# Patient Record
Sex: Female | Born: 1975 | Race: Black or African American | Hispanic: No | Marital: Married | Smoking: Never smoker
Health system: Southern US, Community
[De-identification: ages and names within clinical notes are randomized; demographics above are authoritative.]

## PROBLEM LIST (undated history)

## (undated) DIAGNOSIS — R0602 Shortness of breath: Secondary | ICD-10-CM

## (undated) DIAGNOSIS — F419 Anxiety disorder, unspecified: Secondary | ICD-10-CM

## (undated) DIAGNOSIS — T4145XA Adverse effect of unspecified anesthetic, initial encounter: Secondary | ICD-10-CM

## (undated) DIAGNOSIS — G8929 Other chronic pain: Secondary | ICD-10-CM

## (undated) DIAGNOSIS — M549 Dorsalgia, unspecified: Secondary | ICD-10-CM

## (undated) DIAGNOSIS — I499 Cardiac arrhythmia, unspecified: Secondary | ICD-10-CM

## (undated) DIAGNOSIS — R109 Unspecified abdominal pain: Secondary | ICD-10-CM

## (undated) HISTORY — PX: OTHER SURGICAL HISTORY: SHX169

## (undated) HISTORY — PX: CERVICAL CERCLAGE: SHX1329

## (undated) HISTORY — DX: Cardiac arrhythmia, unspecified: I49.9

## (undated) HISTORY — DX: Shortness of breath: R06.02

## (undated) HISTORY — PX: TONSILLECTOMY: SUR1361

---

## 2009-01-28 DIAGNOSIS — T8859XA Other complications of anesthesia, initial encounter: Secondary | ICD-10-CM

## 2009-01-28 HISTORY — DX: Other complications of anesthesia, initial encounter: T88.59XA

## 2014-01-19 ENCOUNTER — Emergency Department (HOSPITAL_COMMUNITY): Payer: BC Managed Care – PPO

## 2014-01-19 ENCOUNTER — Emergency Department (HOSPITAL_COMMUNITY)
Admission: EM | Admit: 2014-01-19 | Discharge: 2014-01-20 | Disposition: A | Discharge status: Home / Self Care | Payer: BC Managed Care – PPO | Attending: Emergency Medicine | Admitting: Emergency Medicine

## 2014-01-19 ENCOUNTER — Encounter (HOSPITAL_COMMUNITY): Payer: Self-pay | Admitting: Emergency Medicine

## 2014-01-19 DIAGNOSIS — F411 Generalized anxiety disorder: Secondary | ICD-10-CM | POA: Insufficient documentation

## 2014-01-19 DIAGNOSIS — K5900 Constipation, unspecified: Secondary | ICD-10-CM

## 2014-01-19 DIAGNOSIS — T887XXA Unspecified adverse effect of drug or medicament, initial encounter: Secondary | ICD-10-CM

## 2014-01-19 DIAGNOSIS — Z79899 Other long term (current) drug therapy: Secondary | ICD-10-CM | POA: Insufficient documentation

## 2014-01-19 DIAGNOSIS — T448X5A Adverse effect of centrally-acting and adrenergic-neuron-blocking agents, initial encounter: Secondary | ICD-10-CM | POA: Insufficient documentation

## 2014-01-19 DIAGNOSIS — E860 Dehydration: Secondary | ICD-10-CM | POA: Insufficient documentation

## 2014-01-19 DIAGNOSIS — R1032 Left lower quadrant pain: Secondary | ICD-10-CM | POA: Insufficient documentation

## 2014-01-19 LAB — COMPREHENSIVE METABOLIC PANEL
ALT: 15 U/L (ref 0–35)
AST: 25 U/L (ref 0–37)
Albumin: 4.8 g/dL (ref 3.5–5.2)
Alkaline Phosphatase: 69 U/L (ref 39–117)
BILIRUBIN TOTAL: 0.6 mg/dL (ref 0.3–1.2)
BUN: 21 mg/dL (ref 6–23)
CALCIUM: 10.5 mg/dL (ref 8.4–10.5)
CHLORIDE: 98 meq/L (ref 96–112)
CO2: 23 meq/L (ref 19–32)
Creatinine, Ser: 1.44 mg/dL — ABNORMAL HIGH (ref 0.50–1.10)
GFR calc Af Amer: 53 mL/min — ABNORMAL LOW (ref >90)
GFR, EST NON AFRICAN AMERICAN: 46 mL/min — AB (ref >90)
Glucose, Bld: 104 mg/dL — ABNORMAL HIGH (ref 70–99)
POTASSIUM: 3.7 meq/L (ref 3.7–5.3)
SODIUM: 139 meq/L (ref 137–147)
Total Protein: 9.3 g/dL — ABNORMAL HIGH (ref 6.0–8.3)

## 2014-01-19 LAB — CBC WITH DIFFERENTIAL/PLATELET
BASOS ABS: 0 10*3/uL (ref 0.0–0.1)
Basophils Relative: 0 % (ref 0–1)
Eosinophils Absolute: 0.2 10*3/uL (ref 0.0–0.7)
Eosinophils Relative: 2 % (ref 0–5)
HCT: 45.2 % (ref 36.0–46.0)
Hemoglobin: 15.6 g/dL — ABNORMAL HIGH (ref 12.0–15.0)
LYMPHS PCT: 23 % (ref 12–46)
Lymphs Abs: 1.9 10*3/uL (ref 0.7–4.0)
MCH: 28.7 pg (ref 26.0–34.0)
MCHC: 34.5 g/dL (ref 30.0–36.0)
MCV: 83.2 fL (ref 78.0–100.0)
Monocytes Absolute: 0.6 10*3/uL (ref 0.1–1.0)
Monocytes Relative: 8 % (ref 3–12)
NEUTROS ABS: 5.4 10*3/uL (ref 1.7–7.7)
NEUTROS PCT: 67 % (ref 43–77)
PLATELETS: 427 10*3/uL — AB (ref 150–400)
RBC: 5.43 MIL/uL — ABNORMAL HIGH (ref 3.87–5.11)
RDW: 13.4 % (ref 11.5–15.5)
WBC: 8.1 10*3/uL (ref 4.0–10.5)

## 2014-01-19 NOTE — ED Notes (Signed)
The pt has not felt she could get her breath very well for approx one week hyperventilating in triage.  She has felt strange and dizzy also.  She was switched to a new bp med one week ago and she has not felt well since then.Marland Kitchen  Hx of anxiety and or panic  Attacks but these do not fell like that

## 2014-01-20 MED ORDER — ONDANSETRON 4 MG PO TBDP
8.0000 mg | ORAL_TABLET | Freq: Once | ORAL | Status: AC
Start: 2014-01-20 — End: 2014-01-20
  Administered 2014-01-20: 8 mg via ORAL
  Filled 2014-01-20: qty 2

## 2014-01-20 MED ORDER — ONDANSETRON 8 MG PO TBDP: 8.0000 mg | ORAL_TABLET | Freq: Three times a day (TID) | ORAL | Status: DC | PRN

## 2014-01-20 MED ORDER — POLYETHYLENE GLYCOL 3350 17 G PO PACK: 17.0000 g | PACK | Freq: Every day | ORAL | Status: DC

## 2014-01-20 MED ORDER — DOCUSATE SODIUM 100 MG PO CAPS: 100.0000 mg | ORAL_CAPSULE | Freq: Two times a day (BID) | ORAL | Status: DC

## 2014-01-20 NOTE — ED Notes (Signed)
MD at bedside. 

## 2014-01-20 NOTE — Discharge Instructions (Signed)
Increase your fluid and fiber intake.  One way to do this is by "green" smoothies, with spinach, frozen fruit, and yogurt, water and juice.  Stop taking Adipex-P (phentermine) as it may be causing some of your symptoms.     Constipation, Adult Constipation is when a person has fewer than 3 bowel movements a week; has difficulty having a bowel movement; or has stools that are dry, hard, or larger than normal. As people grow older, constipation is more common. If you try to fix constipation with medicines that make you have a bowel movement (laxatives), the problem may get worse. Long-term laxative use may cause the muscles of the colon to become weak. A low-fiber diet, not taking in enough fluids, and taking certain medicines may make constipation worse. CAUSES   Certain medicines, such as antidepressants, pain medicine, iron supplements, antacids, and water pills.   Certain diseases, such as diabetes, irritable bowel syndrome (IBS), thyroid disease, or depression.   Not drinking enough water.   Not eating enough fiber-rich foods.   Stress or travel.  Lack of physical activity or exercise.  Not going to the restroom when there is the urge to have a bowel movement.  Ignoring the urge to have a bowel movement.  Using laxatives too much. SYMPTOMS   Having fewer than 3 bowel movements a week.   Straining to have a bowel movement.   Having hard, dry, or larger than normal stools.   Feeling full or bloated.   Pain in the lower abdomen.  Not feeling relief after having a bowel movement. DIAGNOSIS  Your caregiver will take a medical history and perform a physical exam. Further testing may be done for severe constipation. Some tests may include:   A barium enema X-ray to examine your rectum, colon, and sometimes, your small intestine.  A sigmoidoscopy to examine your lower colon.  A colonoscopy to examine your entire colon. TREATMENT  Treatment will depend on the severity  of your constipation and what is causing it. Some dietary treatments include drinking more fluids and eating more fiber-rich foods. Lifestyle treatments may include regular exercise. If these diet and lifestyle recommendations do not help, your caregiver may recommend taking over-the-counter laxative medicines to help you have bowel movements. Prescription medicines may be prescribed if over-the-counter medicines do not work.  HOME CARE INSTRUCTIONS   Increase dietary fiber in your diet, such as fruits, vegetables, whole grains, and beans. Limit high-fat and processed sugars in your diet, such as [place] fries, hamburgers, cookies, candies, and soda.   A fiber supplement may be added to your diet if you cannot get enough fiber from foods.   Drink enough fluids to keep your urine clear or pale yellow.   Exercise regularly or as directed by your caregiver.   Go to the restroom when you have the urge to go. Do not hold it.  Only take medicines as directed by your caregiver. Do not take other medicines for constipation without talking to your caregiver first. [place] IF:   You have bright red blood in your stool.   Your constipation lasts for more than 4 days or gets worse.   You have abdominal or rectal pain.   You have thin, pencil-like stools.  You have unexplained weight loss. MAKE SURE YOU:   Understand these instructions.  Will watch your condition.  Will get help right away if you are not doing well or get worse. Document Released: 06/12/2004 Document Revised: 12/07/2011 Document Reviewed:  06/26/2013 ExitCare Patient Information 2014 [place].  Fiber Content in Foods Drinking plenty of fluids and consuming foods high in fiber can help with constipation. See the list below for the fiber content of some common foods. Starches and Grains / Dietary Fiber (g)  Cheerios, 1 cup / 3 g  Kellogg's Corn Flakes, 1 cup / 0.7 g  Rice Krispies, 1  cup  / 0.3 g  Quaker Oat Life Cereal,  cup / 2.1 g  Oatmeal, instant (cooked),  cup / 2 g  Kellogg's Frosted Mini Wheats, 1 cup / 5.1 g  Rice, brown, long-grain (cooked), 1 cup / 3.5 g  Rice, white, long-grain (cooked), 1 cup / 0.6 g  Macaroni, cooked, enriched, 1 cup / 2.5 g Legumes / Dietary Fiber (g)  Beans, baked, canned, plain or vegetarian,  cup / 5.2 g  Beans, kidney, canned,  cup / 6.8 g  Beans, pinto, dried (cooked),  cup / 7.7 g  Beans, pinto, canned,  cup / 5.5 g Breads and Crackers / Dietary Fiber (g)  Graham crackers, plain or honey, 2 squares / 0.7 g  Saltine crackers, 3 squares / 0.3 g  Pretzels, plain, salted, 10 pieces / 1.8 g  Bread, whole-wheat, 1 slice / 1.9 g  Bread, white, 1 slice / 0.7 g  Bread, raisin, 1 slice / 1.2 g  Bagel, plain, 3 oz / 2 g  Tortilla, flour, 1 oz / 0.9 g  Tortilla, corn, 1 small / 1.5 g  Bun, hamburger or hotdog, 1 small / 0.9 g Fruits / Dietary Fiber (g)  Apple, raw with skin, 1 medium / 4.4 g  Applesauce, sweetened,  cup / 1.5 g  Banana,  medium / 1.5 g  Grapes, 10 grapes / 0.4 g  Orange, 1 small / 2.3 g  Raisin, 1.5 oz / 1.6 g  Melon, 1 cup / 1.4 g Vegetables / Dietary Fiber (g)  Green beans, canned,  cup / 1.3 g  Carrots (cooked),  cup / 2.3 g  Broccoli (cooked),  cup / 2.8 g  Peas, frozen (cooked),  cup / 4.4 g  Potatoes, mashed,  cup / 1.6 g  Lettuce, 1 cup / 0.5 g  Corn, canned,  cup / 1.6 g  Tomato,  cup / 1.1 g Document Released: 01/31/2007 Document Revised: 12/07/2011 Document Reviewed: 03/28/2007 ExitCare Patient Information 2014 [place], [place].  Dehydration, Adult Dehydration is when you lose more fluids from the body than you take in. Vital organs like the kidneys, brain, and heart cannot function without a proper amount of fluids and salt. Any loss of fluids from the body can cause dehydration.  CAUSES   Vomiting.  Diarrhea.  Excessive sweating.  Excessive urine  output.  Fever. SYMPTOMS  Mild dehydration  Thirst.  Dry lips.  Slightly dry mouth. Moderate dehydration  Very dry mouth.  Sunken eyes.  Skin does not bounce back quickly when lightly pinched and released.  Dark urine and decreased urine production.  Decreased tear production.  Headache. Severe dehydration  Very dry mouth.  Extreme thirst.  Rapid, weak pulse (more than 100 beats per minute at rest).  Cold hands and feet.  Not able to sweat in spite of heat and temperature.  Rapid breathing.  Blue lips.  Confusion and lethargy.  Difficulty being awakened.  Minimal urine production.  No tears. DIAGNOSIS  Your caregiver will diagnose dehydration based on your symptoms and your exam. Blood and urine tests will help confirm the diagnosis. The  diagnostic evaluation should also identify the cause of dehydration. TREATMENT  Treatment of mild or moderate dehydration can often be done at home by increasing the amount of fluids that you drink. It is best to drink small amounts of fluid more often. Drinking too much at one time can make vomiting worse. Refer to the home care instructions below. Severe dehydration needs to be treated at the hospital where you will probably be given intravenous (IV) fluids that contain water and electrolytes. HOME CARE INSTRUCTIONS   Ask your caregiver about specific rehydration instructions.  Drink enough fluids to keep your urine clear or pale yellow.  Drink small amounts frequently if you have nausea and vomiting.  Eat as you normally do.  Avoid:  Foods or drinks high in sugar.  Carbonated drinks.  Juice.  Extremely hot or cold fluids.  Drinks with caffeine.  Fatty, greasy foods.  Alcohol.  Tobacco.  Overeating.  Gelatin desserts.  Wash your hands well to avoid spreading bacteria and viruses.  Only take over-the-counter or prescription medicines for pain, discomfort, or fever as directed by your  caregiver.  Ask your caregiver if you should continue all prescribed and over-the-counter medicines.  Keep all follow-up appointments with your caregiver. SEEK MEDICAL CARE IF:  You have abdominal pain and it increases or stays in one area (localizes).  You have a rash, stiff neck, or severe headache.  You are irritable, sleepy, or difficult to awaken.  You are weak, dizzy, or extremely thirsty. SEEK IMMEDIATE MEDICAL CARE IF:   You are unable to keep fluids down or you get worse despite treatment.  You have frequent episodes of vomiting or diarrhea.  You have blood or green matter (bile) in your vomit.  You have blood in your stool or your stool looks black and tarry.  You have not urinated in 6 to 8 hours, or you have only urinated a small amount of very dark urine.  You have a fever.  You faint. MAKE SURE YOU:   Understand these instructions.  Will watch your condition.  Will get help right away if you are not doing well or get worse. Document Released: 09/14/2005 Document Revised: 12/07/2011 Document Reviewed: 05/04/2011 [facility] Patient Information 2014 [place], [place].  Rehydration, Adult Rehydration is the replacement of body fluids lost during dehydration. Dehydration is an extreme loss of body fluids to the point of body function impairment. There are many ways extreme fluid loss can occur, including vomiting, diarrhea, or excess sweating. Recovering from dehydration requires replacing lost fluids, continuing to eat to maintain strength, and avoiding foods and beverages that may contribute to further fluid loss or may increase nausea. HOW TO REHYDRATE In most cases, rehydration involves the replacement of not only fluids but also carbohydrates and basic body salts. Rehydration with an oral rehydration solution is one way to replace essential nutrients lost through dehydration. An oral rehydration solution can be purchased at pharmacies, retail stores, and online.  Premixed packets of powder that you combine with water to make a solution are also sold. You can prepare an oral rehydration solution at home by mixing the following ingredients together:      tsp table salt.   tsp baking soda.   tsp salt substitute containing potassium chloride.  1 tablespoons sugar.  1 L (34 oz) of water. Be sure to use exact measurements. Including too much sugar can make diarrhea worse. Drink  1 cup (120 240 mL) of oral rehydration solution each time you have diarrhea or  vomit. If drinking this amount makes your vomiting worse, try drinking smaller amounts more often. For example, drink 1 3 tsp every 5 10 minutes.  A general rule for staying hydrated is to drink 1 2 L of fluid per day. Talk to your caregiver about the specific amount you should be drinking each day. Drink enough fluids to keep your urine clear or pale yellow. EATING WHEN DEHYDRATED Even if you have had severe sweating or you are having diarrhea, do not stop eating. Many healthy items in a normal diet are okay to continue eating while recovering from dehydration. The following tips can help you to lessen nausea when you eat:  Ask someone else to prepare your food. Cooking smells may worsen nausea.  Eat in a well-ventilated room away from cooking smells.  Sit up when you eat. Avoid lying down until 1 2 hours after eating.  Eat small amounts when you eat.  Eat foods that are easy to digest. These include soft, well-cooked, or mashed foods. FOODS AND BEVERAGES TO AVOID Avoid eating or drinking the following foods and beverages that may increase nausea or further loss of fluid:   Fruit juices with a high sugar content, such as concentrated juices.  Alcohol.  Beverages containing caffeine.  Carbonated drinks. They may cause a lot of gas.  Foods that may cause a lot of gas, such as cabbage, broccoli, and beans.  Fatty, greasy, and fried foods.  Spicy, very salty, and very sweet foods or  drinks.  Foods or drinks that are very hot or very cold. Consume food or drinks at or near room temperature.  Foods that need a lot of chewing, such as raw vegetables.  Foods that are sticky or hard to swallow, such as peanut butter. Document Released: 12/07/2011 Document Revised: 06/08/2012 Document Reviewed: 12/07/2011 [facility] Patient Information 2014 [place].

## 2014-01-20 NOTE — ED Provider Notes (Signed)
CSN: [id number]     Arrival date & time 01/19/14  2039 History   First MD Initiated Contact with Patient 01/20/14 [phone]     Chief Complaint  Patient presents with  . Shortness of Breath     (Consider location/radiation/quality/duration/timing/severity/associated sxs/prior Treatment) HPI 38 year old female presents to emergency department with complaint of one week of palpitations, and jitteriness, chest tightness, dizziness.  Symptoms started soon after seeing her new private care doctor who started her on Topamax, Maxzide, phentermine.  Patient is concerned that it is her blood pressure medicine is making her feel this way.  Patient has history of anxiety, but reports her symptoms do not feel like a panic attack.  Patient reports she's had nausea for which she has taken Phenergan.  Patient also complaining of some mild diffuse lower abdominal pain.  Patient reports it's been 5 days since her last bowel movement.  After seeing her doctor a week ago, he prescribed her Linzess for constipation.  Patient reports she took it for a day or 2, and then had explosive watery diarrhea.  Since that time, she has been wary of trying other medications for her constipation.  No fevers chills cough.  No headache, no weakness. History reviewed. No pertinent past medical history. History reviewed. No pertinent past surgical history. No family history on file. History  Substance Use Topics  . Smoking status: Never Smoker   . Smokeless tobacco: Not on file  . Alcohol Use: Yes   OB History   Grav Para Term Preterm Abortions TAB SAB Ect Mult Living                 Review of Systems  See History of Present Illness; otherwise all other systems are reviewed and negative   Allergies  Review of patient's allergies indicates no known allergies.  Home Medications   Prior to Admission medications   Medication Sig Start Date End Date Taking? Authorizing Provider  ALPRAZolam Duanne Moron) 1 MG tablet Take 1 mg by  mouth 2 (two) times daily as needed for anxiety.   Yes Historical Provider, MD  docusate sodium (COLACE) 100 MG capsule Take 1 capsule (100 mg total) by mouth every 12 (twelve) hours. 01/20/14   Kalman Drape, MD  ondansetron (ZOFRAN ODT) 8 MG disintegrating tablet Take 1 tablet (8 mg total) by mouth every 8 (eight) hours as needed for nausea or vomiting. 01/20/14   Kalman Drape, MD  polyethylene glycol (MIRALAX / Floria Raveling) packet Take 17 g by mouth daily. 01/20/14   Kalman Drape, MD  promethazine (PHENERGAN) 25 MG tablet Take 25 mg by mouth 2 (two) times daily as needed for nausea or vomiting.   Yes Historical Provider, MD  rizatriptan (MAXALT) 10 MG tablet Take 10 mg by mouth 2 (two) times daily as needed for migraine. May repeat in 2 hours if needed   Yes Historical Provider, MD  rosuvastatin (CRESTOR) 20 MG tablet Take 20 mg by mouth daily.   Yes Historical Provider, MD  topiramate (TOPAMAX) 50 MG tablet Take 50 mg by mouth daily.   Yes Historical Provider, MD  triamterene-hydrochlorothiazide (MAXZIDE-25) 37.5-25 MG per tablet Take 1 tablet by mouth daily.   Yes Historical Provider, MD   BP 122/90  Pulse 88  Temp(Src) 97.7 F (36.5 C) (Oral)  Resp 22  SpO2 100% Physical Exam  Nursing note and vitals reviewed. Constitutional: She is oriented to person, place, and time. She appears well-developed and well-nourished.  HENT:  Head: Normocephalic and  atraumatic.  Right Ear: External ear normal.  Left Ear: External ear normal.  Nose: Nose normal.  Dry mucous membranes  Eyes: Conjunctivae and EOM are normal. Pupils are equal, round, and reactive to light.  Neck: Normal range of motion. Neck supple. No JVD present. No tracheal deviation present. No thyromegaly present.  Cardiovascular: Normal rate, regular rhythm, normal heart sounds and intact distal pulses.  Exam reveals no gallop and no friction rub.   No murmur heard. Pulmonary/Chest: Effort normal and breath sounds normal. No stridor. No  respiratory distress. She has no wheezes. She has no rales. She exhibits no tenderness.  Abdominal: Soft. Bowel sounds are normal. She exhibits no distension and no mass. There is tenderness (very mild diffuse abdominal pain worse in left lower quadrant). There is no rebound and no guarding.  Musculoskeletal: Normal range of motion. She exhibits no edema and no tenderness.  Lymphadenopathy:    She has no cervical adenopathy.  Neurological: She is alert and oriented to person, place, and time. She has normal reflexes. No cranial nerve deficit. She exhibits normal muscle tone. Coordination normal.  Skin: Skin is warm and dry. No rash noted. No erythema. No pallor.  Psychiatric: She has a normal mood and affect. Her behavior is normal. Judgment and thought content normal.    ED Course  Procedures (including critical care time) Labs Review Labs Reviewed  CBC WITH DIFFERENTIAL - Abnormal; Notable for the following:    RBC 5.43 (*)    Hemoglobin 15.6 (*)    Platelets 427 (*)    All other components within normal limits  COMPREHENSIVE METABOLIC PANEL - Abnormal; Notable for the following:    Glucose, Bld 104 (*)    Creatinine, Ser 1.44 (*)    Total Protein 9.3 (*)    GFR calc non Af Amer 46 (*)    GFR calc Af Amer 53 (*)    All other components within normal limits    Imaging Review Dg Chest 2 View  01/19/2014   CLINICAL DATA:  Cough, shortness of breath and dizziness for 1 week, history hypercholesterolemia  EXAM: CHEST  2 VIEW  COMPARISON:  None.  FINDINGS: Normal heart size, mediastinal contours, and pulmonary vascularity.  Lungs clear.  No pleural effusion or pneumothorax.  No acute osseous findings.  IMPRESSION: No acute abnormalities.   Electronically Signed   By: Lavonia Dana M.D.   On: 01/19/2014 21:56     EKG Interpretation   Date/Time:  Friday January 19 2014 20:45:12 EDT Ventricular Rate:  99 PR Interval:  138 QRS Duration: 100 QT Interval:  338 QTC Calculation: 433 R Axis:    65 Text Interpretation:  Normal sinus rhythm Possible Lateral infarct , age  undetermined Possible Inferior infarct , age undetermined Abnormal ECG No  old tracing to compare Confirmed by Taaliyah Delpriore  MD, Courtez Twaddle ([id number]) on 01/20/2014  12:46:55 AM      MDM   Final diagnoses:  Dehydration  Constipation  Medication side effect    38 yo female with one week of palpitations, dizziness, weakness, and shortness of breath.  Pt recenly started on phentermine, which may be prompting may the symptoms.  Patient also slightly dehydrated clinically and on labs, and this may be due to her hydrochlorothiazide use.  Patient encouraged to increase her water intake, fiber intake.  She is instructed to stop taking her phentermine.  She is to followup with her primary care Dr. this week.    Kalman Drape, MD 01/21/14 [id number]

## 2015-06-20 ENCOUNTER — Encounter (HOSPITAL_COMMUNITY): Payer: Self-pay | Admitting: Emergency Medicine

## 2015-06-20 ENCOUNTER — Telehealth: Payer: Self-pay | Admitting: *Deleted

## 2015-06-20 ENCOUNTER — Emergency Department (HOSPITAL_COMMUNITY): Payer: BLUE CROSS/BLUE SHIELD

## 2015-06-20 ENCOUNTER — Emergency Department (HOSPITAL_COMMUNITY)
Admission: EM | Admit: 2015-06-20 | Discharge: 2015-06-21 | Disposition: A | Discharge status: Home / Self Care | Payer: BLUE CROSS/BLUE SHIELD | Attending: Emergency Medicine | Admitting: Emergency Medicine

## 2015-06-20 DIAGNOSIS — Z7982 Long term (current) use of aspirin: Secondary | ICD-10-CM | POA: Insufficient documentation

## 2015-06-20 DIAGNOSIS — F419 Anxiety disorder, unspecified: Secondary | ICD-10-CM | POA: Diagnosis not present

## 2015-06-20 DIAGNOSIS — G8929 Other chronic pain: Secondary | ICD-10-CM | POA: Diagnosis not present

## 2015-06-20 DIAGNOSIS — R000 Tachycardia, unspecified: Secondary | ICD-10-CM | POA: Diagnosis not present

## 2015-06-20 DIAGNOSIS — R05 Cough: Secondary | ICD-10-CM | POA: Diagnosis not present

## 2015-06-20 DIAGNOSIS — R110 Nausea: Secondary | ICD-10-CM | POA: Diagnosis not present

## 2015-06-20 DIAGNOSIS — K1329 Other disturbances of oral epithelium, including tongue: Secondary | ICD-10-CM | POA: Insufficient documentation

## 2015-06-20 DIAGNOSIS — N898 Other specified noninflammatory disorders of vagina: Secondary | ICD-10-CM | POA: Insufficient documentation

## 2015-06-20 DIAGNOSIS — R2243 Localized swelling, mass and lump, lower limb, bilateral: Secondary | ICD-10-CM | POA: Diagnosis not present

## 2015-06-20 DIAGNOSIS — Z9889 Other specified postprocedural states: Secondary | ICD-10-CM | POA: Diagnosis not present

## 2015-06-20 DIAGNOSIS — R499 Unspecified voice and resonance disorder: Secondary | ICD-10-CM | POA: Diagnosis not present

## 2015-06-20 DIAGNOSIS — R0602 Shortness of breath: Secondary | ICD-10-CM | POA: Diagnosis not present

## 2015-06-20 DIAGNOSIS — R109 Unspecified abdominal pain: Secondary | ICD-10-CM | POA: Diagnosis not present

## 2015-06-20 DIAGNOSIS — Z79899 Other long term (current) drug therapy: Secondary | ICD-10-CM | POA: Diagnosis not present

## 2015-06-20 DIAGNOSIS — R1030 Lower abdominal pain, unspecified: Secondary | ICD-10-CM | POA: Diagnosis present

## 2015-06-20 DIAGNOSIS — R51 Headache: Secondary | ICD-10-CM | POA: Diagnosis not present

## 2015-06-20 HISTORY — DX: Anxiety disorder, unspecified: F41.9

## 2015-06-20 HISTORY — DX: Unspecified abdominal pain: R10.9

## 2015-06-20 HISTORY — DX: Other chronic pain: G89.29

## 2015-06-20 LAB — URINALYSIS, ROUTINE W REFLEX MICROSCOPIC
Bilirubin Urine: NEGATIVE
GLUCOSE, UA: NEGATIVE mg/dL
Hgb urine dipstick: NEGATIVE
KETONES UR: NEGATIVE mg/dL
Leukocytes, UA: NEGATIVE
Nitrite: NEGATIVE
Protein, ur: NEGATIVE mg/dL
SPECIFIC GRAVITY, URINE: 1.015 (ref 1.005–1.030)
Urobilinogen, UA: 0.2 mg/dL (ref 0.0–1.0)
pH: 7 (ref 5.0–8.0)

## 2015-06-20 LAB — COMPREHENSIVE METABOLIC PANEL
ALBUMIN: 4.2 g/dL (ref 3.5–5.0)
ALK PHOS: 59 U/L (ref 38–126)
ALT: 15 U/L (ref 14–54)
ANION GAP: 10 (ref 5–15)
AST: 25 U/L (ref 15–41)
BILIRUBIN TOTAL: 0.7 mg/dL (ref 0.3–1.2)
BUN: 10 mg/dL (ref 6–20)
CO2: 26 mmol/L (ref 22–32)
Calcium: 9.2 mg/dL (ref 8.9–10.3)
Chloride: 102 mmol/L (ref 101–111)
Creatinine, Ser: 0.79 mg/dL (ref 0.44–1.00)
GFR calc non Af Amer: >60 mL/min (ref >60)
GLUCOSE: 89 mg/dL (ref 65–99)
POTASSIUM: 3.9 mmol/L (ref 3.5–5.1)
SODIUM: 138 mmol/L (ref 135–145)
Total Protein: 7.9 g/dL (ref 6.5–8.1)

## 2015-06-20 LAB — CBC WITH DIFFERENTIAL/PLATELET
BASOS PCT: 1 %
Basophils Absolute: 0 10*3/uL (ref 0.0–0.1)
EOS ABS: 0.3 10*3/uL (ref 0.0–0.7)
Eosinophils Relative: 6 %
HEMATOCRIT: 36.8 % (ref 36.0–46.0)
Hemoglobin: 12.1 g/dL (ref 12.0–15.0)
Lymphocytes Relative: 32 %
Lymphs Abs: 1.8 10*3/uL (ref 0.7–4.0)
MCH: 28.5 pg (ref 26.0–34.0)
MCHC: 32.9 g/dL (ref 30.0–36.0)
MCV: 86.6 fL (ref 78.0–100.0)
MONO ABS: 0.4 10*3/uL (ref 0.1–1.0)
Monocytes Relative: 8 %
Neutro Abs: 3.1 10*3/uL (ref 1.7–7.7)
Neutrophils Relative %: 53 %
Platelets: 353 10*3/uL (ref 150–400)
RBC: 4.25 MIL/uL (ref 3.87–5.11)
RDW: 13.4 % (ref 11.5–15.5)
WBC: 5.7 10*3/uL (ref 4.0–10.5)

## 2015-06-20 LAB — LIPASE, BLOOD: LIPASE: 18 U/L — AB (ref 22–51)

## 2015-06-20 LAB — I-STAT BETA HCG BLOOD, ED (MC, WL, AP ONLY): I-stat hCG, quantitative: <5 m[IU]/mL (ref <5)

## 2015-06-20 MED ORDER — SODIUM CHLORIDE 0.9 % IV BOLUS (SEPSIS)
1000.0000 mL | Freq: Once | INTRAVENOUS | Status: AC
  Administered 2015-06-20: 1000 mL via INTRAVENOUS

## 2015-06-20 MED ORDER — HYDROMORPHONE HCL 1 MG/ML IJ SOLN
1.0000 mg | Freq: Once | INTRAMUSCULAR | Status: AC
  Administered 2015-06-20: 1 mg via INTRAVENOUS
  Filled 2015-06-20: qty 1

## 2015-06-20 MED ORDER — IOHEXOL 300 MG/ML  SOLN
50.0000 mL | Freq: Once | INTRAMUSCULAR | Status: AC | PRN
  Administered 2015-06-20: 50 mL via ORAL

## 2015-06-20 MED ORDER — SODIUM CHLORIDE 0.9 % IV SOLN: INTRAVENOUS | Status: DC

## 2015-06-20 MED ORDER — IOHEXOL 300 MG/ML  SOLN: 50.0000 mL | Freq: Once | INTRAMUSCULAR | Status: DC | PRN

## 2015-06-20 MED ORDER — ONDANSETRON HCL 4 MG/2ML IJ SOLN
4.0000 mg | Freq: Once | INTRAMUSCULAR | Status: AC
  Administered 2015-06-20: 4 mg via INTRAVENOUS
  Filled 2015-06-20: qty 2

## 2015-06-20 MED ORDER — IOHEXOL 300 MG/ML  SOLN
100.0000 mL | Freq: Once | INTRAMUSCULAR | Status: AC | PRN
  Administered 2015-06-20: 100 mL via INTRAVENOUS

## 2015-06-20 NOTE — Telephone Encounter (Signed)
Pt states Dr.Hasanaji had referred pt to Dr. Elonda Husky for severe abdominal pain. Pt states taking pain medication Dr. Amanda Pea has prescribed but no relief. Would like for Dr. Elonda Husky to be able to see her asap. Pt informed soonest available appt was next Tuesday, June 25, 2015. Dr. Elonda Husky out of office 09/23-09/26/2016. Pt encouraged to go to ER if abdominal pain so severe and pain meds not helping. Pt verbalized understanding.

## 2015-06-20 NOTE — ED Notes (Signed)
Patient complaining of "severe abdominal pain" off and on for 5 years with constant pain for the last 2 weeks.

## 2015-06-20 NOTE — ED Provider Notes (Signed)
CSN: [id number]     Arrival date & time 06/20/15  2120 History  This chart was scribed for Fredia Sorrow, MD by Helane Gunther, ED Scribe. This patient was seen in room APA05/APA05 and the patient's care was started at 10:15 PM.      Chief Complaint  Patient presents with  . Abdominal Pain   The history is provided by the patient and a relative. No language interpreter was used.   HPI Comments: Tara Barrett is a 39 y.o. female with a PMHx of chronic intermittent abdominal pain who presents to the Emergency Department complaining of constant lower abdominal pain onset 2 weeks ago. She rates her pain as a 10/10. She notes that this pain is similar to her previous chronic pain, but has changed to constant pain. She reports associated nausea and malodorous vaginal discharge (onset 8 years ago, unchanged), as well as productive cough (yellow phlegm), SOB, back pain, HA, and dizziness. Per relatives her voice has changed 3 days ago and her tongue looks white. She has tried taking pain pills with no relief. She notes she has a consult scheduled with Dr Augusto Garbe at [facility] for an emergency hysterectomy. She notes a PMHx of a uterine ablation and states her uterus fills up with blood. Her ablation was done 1.5 years ago by Dr Derenda Mis and pt states that since then she has had chronic abdominal pain. Pt denies vomiting, diarrhea, fever, sore throat, and vaginal bleeding.  Past Medical History  Diagnosis Date  . Chronic abdominal pain   . Anxiety    Past Surgical History  Procedure Laterality Date  . Uterine ablation    . Cesarean section    . Tonsillectomy     History reviewed. No pertinent family history. Social History  Substance Use Topics  . Smoking status: Never Smoker   . Smokeless tobacco: None  . Alcohol Use: Yes     Comment: occasionally   OB History    No data available     Review of Systems  Constitutional: Negative for fever.  HENT: Positive for voice change. Negative for  sore throat.   Eyes: Negative for visual disturbance.  Respiratory: Positive for cough and shortness of breath.   Cardiovascular: Positive for leg swelling (bilateral). Negative for chest pain.  Gastrointestinal: Positive for nausea. Negative for vomiting.  Genitourinary: Positive for vaginal discharge. Negative for dysuria and vaginal bleeding.  Musculoskeletal: Positive for back pain.  Skin: Negative for rash.  Neurological: Positive for dizziness and headaches.  Psychiatric/Behavioral: Negative for confusion.    Allergies  Review of patient's allergies indicates no known allergies.  Home Medications   Prior to Admission medications   Medication Sig Start Date End Date Taking? Authorizing Andrick Rust  ALPRAZolam Duanne Moron) 1 MG tablet Take 1 mg by mouth 2 (two) times daily as needed for anxiety.   Yes Historical Kathryn Cosby, MD  aspirin EC 325 MG tablet Take 325 mg by mouth every 6 (six) hours as needed for mild pain or moderate pain.   Yes Historical Derin Matthes, MD  butalbital-acetaminophen-caffeine (FIORICET, ESGIC) 50-325-40 MG per tablet Take 1 tablet by mouth 2 (two) times daily as needed for headache.   Yes Historical Jorian Willhoite, MD  HYDROcodone-acetaminophen (NORCO) 7.5-325 MG per tablet Take 1 tablet by mouth 2 (two) times daily. 06/14/15  Yes Historical Nosson Wender, MD  ibuprofen (ADVIL,MOTRIN) 200 MG tablet Take 400 mg by mouth every 6 (six) hours as needed for mild pain or moderate pain.   Yes Historical Mattox Schorr, MD  Multiple Vitamin (MULTIVITAMIN WITH MINERALS) TABS tablet Take 1 tablet by mouth daily.   Yes Historical Chloey Ricard, MD  Omega-3 Fatty Acids (FISH OIL PO) Take 1,500 mg by mouth daily.   Yes Historical Latricia Cerrito, MD  promethazine (PHENERGAN) 25 MG tablet Take 25 mg by mouth 2 (two) times daily as needed for nausea or vomiting.   Yes Historical Jaqwon Manfred, MD  rizatriptan (MAXALT) 10 MG tablet Take 10 mg by mouth 2 (two) times daily as needed for migraine. May repeat in 2 hours if  needed   Yes Historical Cecily Lawhorne, MD  benazepril-hydrochlorthiazide (LOTENSIN HCT) 20-12.5 MG per tablet Take 1 tablet by mouth daily. 05/17/15   Historical Lynden Carrithers, MD   BP 116/97 mmHg  Pulse 76  Temp(Src) 98.1 F (36.7 C) (Oral)  Resp 16  Ht 5\' 9"  (1.753 m)  Wt 220 lb (99.791 kg)  BMI 32.47 kg/m2  SpO2 100% Physical Exam  Constitutional: She is oriented to person, place, and time. She appears well-developed and well-nourished.  HENT:  Head: Normocephalic and atraumatic.  Mouth/Throat: Oropharynx is clear and moist.  Uvula midline, slight white coating to the tongue, no significant redness  Eyes: Conjunctivae and EOM are normal. Pupils are equal, round, and reactive to light. Right eye exhibits no discharge. Left eye exhibits no discharge. No scleral icterus.  Cardiovascular: Regular rhythm.   HR slightly tachycardic  Pulmonary/Chest: Effort normal and breath sounds normal. No respiratory distress.  Abdominal: Soft. Bowel sounds are normal. There is no tenderness.  Musculoskeletal: She exhibits no edema.  No pitting edema  Neurological: She is alert and oriented to person, place, and time. No cranial nerve deficit. She exhibits normal muscle tone. Coordination normal.  Skin: Skin is warm and dry. No rash noted. She is not diaphoretic. No erythema.  Psychiatric: She has a normal mood and affect.  Nursing note and vitals reviewed.   ED Course  Procedures  DIAGNOSTIC STUDIES: Oxygen Saturation is 100% on RA, normal by my interpretation.    COORDINATION OF CARE: 10:28 PM - Discussed plans to order diagnostic studies and imaging. Pt advised of plan for treatment and pt agrees.  Labs Review Labs Reviewed  LIPASE, BLOOD - Abnormal; Notable for the following:    Lipase 18 (*)    All other components within normal limits  URINALYSIS, ROUTINE W REFLEX MICROSCOPIC (NOT AT [facility])  CBC WITH DIFFERENTIAL/PLATELET  COMPREHENSIVE METABOLIC PANEL  I-STAT BETA HCG BLOOD, ED ([facility], [facility], [facility]  ONLY)   Results for orders placed or performed during the hospital encounter of 06/20/15  Urinalysis, Routine w reflex microscopic (not at [facility])  Result Value Ref Range   Color, Urine YELLOW YELLOW   APPearance CLEAR CLEAR   Specific Gravity, Urine 1.015 1.005 - 1.030   pH 7.0 5.0 - 8.0   Glucose, UA NEGATIVE NEGATIVE mg/dL   Hgb urine dipstick NEGATIVE NEGATIVE   Bilirubin Urine NEGATIVE NEGATIVE   Ketones, ur NEGATIVE NEGATIVE mg/dL   Protein, ur NEGATIVE NEGATIVE mg/dL   Urobilinogen, UA 0.2 0.0 - 1.0 mg/dL   Nitrite NEGATIVE NEGATIVE   Leukocytes, UA NEGATIVE NEGATIVE  CBC with Differential/Platelet  Result Value Ref Range   WBC 5.7 4.0 - 10.5 K/uL   RBC 4.25 3.87 - 5.11 MIL/uL   Hemoglobin 12.1 12.0 - 15.0 g/dL   HCT 36.8 36.0 - 46.0 %   MCV 86.6 78.0 - 100.0 fL   MCH 28.5 26.0 - 34.0 pg   MCHC 32.9 30.0 - 36.0 g/dL   RDW  13.4 11.5 - 15.5 %   Platelets 353 150 - 400 K/uL   Neutrophils Relative % 53 %   Neutro Abs 3.1 1.7 - 7.7 K/uL   Lymphocytes Relative 32 %   Lymphs Abs 1.8 0.7 - 4.0 K/uL   Monocytes Relative 8 %   Monocytes Absolute 0.4 0.1 - 1.0 K/uL   Eosinophils Relative 6 %   Eosinophils Absolute 0.3 0.0 - 0.7 K/uL   Basophils Relative 1 %   Basophils Absolute 0.0 0.0 - 0.1 K/uL  Comprehensive metabolic panel  Result Value Ref Range   Sodium 138 135 - 145 mmol/L   Potassium 3.9 3.5 - 5.1 mmol/L   Chloride 102 101 - 111 mmol/L   CO2 26 22 - 32 mmol/L   Glucose, Bld 89 65 - 99 mg/dL   BUN 10 6 - 20 mg/dL   Creatinine, Ser 0.79 0.44 - 1.00 mg/dL   Calcium 9.2 8.9 - 10.3 mg/dL   Total Protein 7.9 6.5 - 8.1 g/dL   Albumin 4.2 3.5 - 5.0 g/dL   AST 25 15 - 41 U/L   ALT 15 14 - 54 U/L   Alkaline Phosphatase 59 38 - 126 U/L   Total Bilirubin 0.7 0.3 - 1.2 mg/dL   GFR calc non Af Amer >60 >60 mL/min   GFR calc Af Amer >60 >60 mL/min   Anion gap 10 5 - 15  Lipase, blood  Result Value Ref Range   Lipase 18 (L) 22 - 51 U/L  I-Stat Beta hCG blood, ED ([facility], [facility],  [facility] only)  Result Value Ref Range   I-stat hCG, quantitative <5.0 <5 mIU/mL   Comment 3             Imaging Review No results found. I have personally reviewed and evaluated these images and lab results as part of my medical decision-making.   EKG Interpretation None      MDM   Final diagnoses:  Chronic abdominal pain    Patient with several year history of intermittent pain like this. Been constant for the past 2 weeks. Evaluated by primary care doctor and by GYN in the [place] area. Patient with recent referral by her primary care doctor to [facility]. Patient has appointment pending that will be on September 27. Patient believes that she should have a hysterectomy GYN doctor in [place] did not want to perform the hysterectomy. Patient has had ablation in the past.  No history of vaginal bleeding of any significance patient's hemoglobin and hematocrit is normal here no leukocytosis. Rest of labs are still pending. CT of the abdomen and pelvis has been ordered. Patient treated for pain. Disposition will be based on the CT findings and the rest of the lab findings. If there is nothing that warrants admission and patient can be discharged home with follow-up with [facility].  Chest x-ray is also pending since patient stated that she had some shortness of breath.    I personally performed the services described in this documentation, which was scribed in my presence. The recorded information has been reviewed and is accurate.    Patient turned over to the overnight physician pending the CT scan results.  Fredia Sorrow, MD 06/20/15 2329

## 2015-06-21 MED ORDER — HYDROMORPHONE HCL 1 MG/ML IJ SOLN
1.0000 mg | Freq: Once | INTRAMUSCULAR | Status: AC
  Administered 2015-06-21: 1 mg via INTRAMUSCULAR

## 2015-06-21 MED ORDER — HYDROMORPHONE HCL 1 MG/ML IJ SOLN
1.0000 mg | Freq: Once | INTRAMUSCULAR | Status: DC
  Filled 2015-06-21: qty 1

## 2015-06-21 MED ORDER — OXYCODONE-ACETAMINOPHEN 5-325 MG PO TABS: 1.0000 | ORAL_TABLET | ORAL | Status: DC | PRN

## 2015-06-21 MED ORDER — ONDANSETRON HCL 4 MG PO TABS: 4.0000 mg | ORAL_TABLET | Freq: Four times a day (QID) | ORAL | Status: AC

## 2015-06-21 MED ORDER — OXYCODONE-ACETAMINOPHEN 5-325 MG PO TABS: 2.0000 | ORAL_TABLET | ORAL | Status: DC | PRN

## 2015-06-21 MED ORDER — NYSTATIN 100000 UNIT/ML MT SUSP: 500000.0000 [IU] | Freq: Four times a day (QID) | OROMUCOSAL | Status: DC

## 2015-06-21 NOTE — Discharge Instructions (Signed)
Abdominal Pain °Many things can cause abdominal pain. Usually, abdominal pain is not caused by a disease and will improve without treatment. It can often be observed and treated at home. Your health care provider will do a physical exam and possibly order blood tests and X-rays to help determine the seriousness of your pain. However, in many cases, more time must pass before a clear cause of the pain can be found. Before that point, your health care provider may not know if you need more testing or further treatment. °HOME CARE INSTRUCTIONS  °Monitor your abdominal pain for any changes. The following actions may help to alleviate any discomfort you are experiencing: °· Only take over-the-counter or prescription medicines as directed by your health care provider. °· Do not take laxatives unless directed to do so by your health care provider. °· Try a clear liquid diet (broth, tea, or water) as directed by your health care provider. Slowly move to a bland diet as tolerated. °SEEK MEDICAL CARE IF: °· You have unexplained abdominal pain. °· You have abdominal pain associated with nausea or diarrhea. °· You have pain when you urinate or have a bowel movement. °· You experience abdominal pain that wakes you in the night. °· You have abdominal pain that is worsened or improved by eating food. °· You have abdominal pain that is worsened with eating fatty foods. °· You have a fever. °SEEK IMMEDIATE MEDICAL CARE IF:  °· Your pain does not go away within 2 hours. °· You keep throwing up (vomiting). °· Your pain is felt only in portions of the abdomen, such as the right side or the left lower portion of the abdomen. °· You pass bloody or black tarry stools. °MAKE SURE YOU: °· Understand these instructions.   °· Will watch your condition.   °· Will get help right away if you are not doing well or get worse.   °Document Released: 06/24/2005 Document Revised: 09/19/2013 Document Reviewed: 05/24/2013 °Exit[employer]® Patient Information  ©2015 [employer]. This information is not intended to replace advice given to you by your health care provider. Make sure you discuss any questions you have with your health care provider. ° °Abdominal Pain, Women °Abdominal (stomach, pelvic, or belly) pain can be caused by many things. It is important to tell your doctor: °· The location of the pain. °· Does it come and go or is it present all the time? °· Are there things that start the pain (eating certain foods, exercise)? °· Are there other symptoms associated with the pain (fever, nausea, vomiting, diarrhea)? °All of this is helpful to know when trying to find the cause of the pain. °CAUSES  °· Stomach: virus or bacteria infection, or ulcer. °· Intestine: appendicitis (inflamed appendix), regional ileitis (Crohn's disease), ulcerative colitis (inflamed colon), irritable bowel syndrome, diverticulitis (inflamed diverticulum of the colon), or cancer of the stomach or intestine. °· Gallbladder disease or stones in the gallbladder. °· Kidney disease, kidney stones, or infection. °· Pancreas infection or cancer. °· Fibromyalgia (pain disorder). °· Diseases of the female organs: °¨ Uterus: fibroid (non-cancerous) tumors or infection. °¨ Fallopian tubes: infection or tubal pregnancy. °¨ Ovary: cysts or tumors. °¨ Pelvic adhesions (scar tissue). °¨ Endometriosis (uterus lining tissue growing in the pelvis and on the pelvic organs). °¨ Pelvic congestion syndrome (female organs filling up with blood just before the menstrual period). °¨ Pain with the menstrual period. °¨ Pain with ovulation (producing an egg). °¨ Pain with an IUD (intrauterine device, birth control) in the uterus. °¨   Cancer of the female organs. °· Functional pain (pain not caused by a disease, may improve without treatment). °· Psychological pain. °· Depression. °DIAGNOSIS  °Your doctor will decide the seriousness of your pain by doing an examination. °· Blood tests. °· X-rays. °· Ultrasound. °· CT  scan (computed tomography, special type of X-ray). °· MRI (magnetic resonance imaging). °· Cultures, for infection. °· Barium enema (dye inserted in the large intestine, to better view it with X-rays). °· Colonoscopy (looking in intestine with a lighted tube). °· Laparoscopy (minor surgery, looking in abdomen with a lighted tube). °· Major abdominal exploratory surgery (looking in abdomen with a large incision). °TREATMENT  °The treatment will depend on the cause of the pain.  °· Many cases can be observed and treated at home. °· Over-the-counter medicines recommended by your caregiver. °· Prescription medicine. °· Antibiotics, for infection. °· Birth control pills, for painful periods or for ovulation pain. °· Hormone treatment, for endometriosis. °· Nerve blocking injections. °· Physical therapy. °· Antidepressants. °· Counseling with a psychologist or psychiatrist. °· Minor or major surgery. °HOME CARE INSTRUCTIONS  °· Do not take laxatives, unless directed by your caregiver. °· Take over-the-counter pain medicine only if ordered by your caregiver. Do not take aspirin because it can cause an upset stomach or bleeding. °· Try a clear liquid diet (broth or water) as ordered by your caregiver. Slowly move to a bland diet, as tolerated, if the pain is related to the stomach or intestine. °· Have a thermometer and take your temperature several times a day, and record it. °· Bed rest and sleep, if it helps the pain. °· Avoid sexual intercourse, if it causes pain. °· Avoid stressful situations. °· Keep your follow-up appointments and tests, as your caregiver orders. °· If the pain does not go away with medicine or surgery, you may try: °¨ Acupuncture. °¨ Relaxation exercises (yoga, meditation). °¨ Group therapy. °¨ Counseling. °SEEK MEDICAL CARE IF:  °· You notice certain foods cause stomach pain. °· Your home care treatment is not helping your pain. °· You need stronger pain medicine. °· You want your IUD  removed. °· You feel faint or lightheaded. °· You develop nausea and vomiting. °· You develop a rash. °· You are having side effects or an allergy to your medicine. °SEEK IMMEDIATE MEDICAL CARE IF:  °· Your pain does not go away or gets worse. °· You have a fever. °· Your pain is felt only in portions of the abdomen. The right side could possibly be appendicitis. The left lower portion of the abdomen could be colitis or diverticulitis. °· You are passing blood in your stools (bright red or black tarry stools, with or without vomiting). °· You have blood in your urine. °· You develop chills, with or without a fever. °· You pass out. °MAKE SURE YOU:  °· Understand these instructions. °· Will watch your condition. °· Will get help right away if you are not doing well or get worse. °Document Released: 07/12/2007 Document Revised: 01/29/2014 Document Reviewed: 08/01/2009 °ExitCare® Patient Information ©2015 [employer]. This information is not intended to replace advice given to you by your health care provider. Make sure you discuss any questions you have with your health care provider. ° °

## 2015-06-21 NOTE — ED Provider Notes (Signed)
Assumed care with CT pending. Pt is not pregnant. CXR results noted, but abx deferred as she has no respiratory complaints and difficult for me to attribute other complaints to possible pneumonia. Afebrile. On increased WOB. No leukocytosis. This seems most in keeping with her chronic abdominal pain. FU with GYN.   Virgel Manifold, MD 06/28/15 [phone]

## 2015-06-21 NOTE — ED Notes (Signed)
Pt states understanding of care given and follow up instructions 

## 2015-06-25 ENCOUNTER — Encounter: Payer: Self-pay | Admitting: Obstetrics & Gynecology

## 2015-06-25 ENCOUNTER — Ambulatory Visit (INDEPENDENT_AMBULATORY_CARE_PROVIDER_SITE_OTHER): Payer: BLUE CROSS/BLUE SHIELD | Admitting: Obstetrics & Gynecology

## 2015-06-25 VITALS — BP 118/70 | Ht 69.0 in | Wt 219.0 lb

## 2015-06-25 DIAGNOSIS — Z9889 Other specified postprocedural states: Secondary | ICD-10-CM

## 2015-06-25 DIAGNOSIS — R102 Pelvic and perineal pain: Secondary | ICD-10-CM

## 2015-06-25 DIAGNOSIS — D259 Leiomyoma of uterus, unspecified: Secondary | ICD-10-CM | POA: Diagnosis not present

## 2015-06-25 MED FILL — Oxycodone w/ Acetaminophen Tab 5-325 MG: ORAL | Qty: 6 | Status: AC

## 2015-06-25 MED FILL — Ondansetron HCl Tab 4 MG: ORAL | Qty: 4 | Status: AC

## 2015-06-26 ENCOUNTER — Telehealth: Payer: Self-pay | Admitting: Obstetrics & Gynecology

## 2015-06-26 NOTE — Telephone Encounter (Signed)
Pt informed per Dr. Elonda Husky, pt will be contacted tomorrow with date and time of surgery. Pt verbalized understanding.

## 2015-06-28 ENCOUNTER — Encounter (HOSPITAL_COMMUNITY): Payer: Self-pay

## 2015-06-28 ENCOUNTER — Encounter (HOSPITAL_COMMUNITY)
Admission: RE | Admit: 2015-06-28 | Discharge: 2015-06-28 | Disposition: A | Discharge status: Home / Self Care | Payer: BLUE CROSS/BLUE SHIELD | Source: Ambulatory Visit | Attending: Obstetrics & Gynecology | Admitting: Obstetrics & Gynecology

## 2015-06-28 ENCOUNTER — Other Ambulatory Visit: Payer: Self-pay | Admitting: Obstetrics & Gynecology

## 2015-06-28 DIAGNOSIS — Z01818 Encounter for other preprocedural examination: Secondary | ICD-10-CM | POA: Diagnosis present

## 2015-06-28 DIAGNOSIS — D259 Leiomyoma of uterus, unspecified: Secondary | ICD-10-CM | POA: Insufficient documentation

## 2015-06-28 HISTORY — DX: Adverse effect of unspecified anesthetic, initial encounter: T41.45XA

## 2015-06-28 LAB — COMPREHENSIVE METABOLIC PANEL
ALT: 59 U/L — ABNORMAL HIGH (ref 14–54)
ANION GAP: 9 (ref 5–15)
AST: 67 U/L — ABNORMAL HIGH (ref 15–41)
Albumin: 4.3 g/dL (ref 3.5–5.0)
Alkaline Phosphatase: 45 U/L (ref 38–126)
BILIRUBIN TOTAL: 0.6 mg/dL (ref 0.3–1.2)
BUN: 11 mg/dL (ref 6–20)
CO2: 22 mmol/L (ref 22–32)
Calcium: 9.2 mg/dL (ref 8.9–10.3)
Chloride: 106 mmol/L (ref 101–111)
Creatinine, Ser: 0.7 mg/dL (ref 0.44–1.00)
GFR calc Af Amer: >60 mL/min (ref >60)
Glucose, Bld: 94 mg/dL (ref 65–99)
POTASSIUM: 4.2 mmol/L (ref 3.5–5.1)
Sodium: 137 mmol/L (ref 135–145)
TOTAL PROTEIN: 7.5 g/dL (ref 6.5–8.1)

## 2015-06-28 LAB — CBC
HEMATOCRIT: 38 % (ref 36.0–46.0)
Hemoglobin: 12.6 g/dL (ref 12.0–15.0)
MCH: 28.6 pg (ref 26.0–34.0)
MCHC: 33.2 g/dL (ref 30.0–36.0)
MCV: 86.4 fL (ref 78.0–100.0)
Platelets: 358 10*3/uL (ref 150–400)
RBC: 4.4 MIL/uL (ref 3.87–5.11)
RDW: 13.9 % (ref 11.5–15.5)
WBC: 8.2 10*3/uL (ref 4.0–10.5)

## 2015-06-28 LAB — HCG, QUANTITATIVE, PREGNANCY

## 2015-06-28 NOTE — Progress Notes (Signed)
Patient ID: Tara Barrett, female   DOB: [birth year], 39 y.o.   MRN: [id number] Chief Complaint  Patient presents with  . Advice Only    Discuss hysterectomy    Blood pressure 118/70, height 5\' 9"  (1.753 m), weight 219 lb (99.338 kg).  39 y.o. No obstetric history on file. No LMP recorded. Patient has had an ablation.   Subjective I am seeing this patient as a referral from Dr Zada Girt due to worsening and unrelenting pelvic lower abdominal pain. She had an endometrial ablation 2+ years ago for menometrorrhagia and dysmenorrhea.  She had known fibroids at the time.  She overall had done pretty well until the last 6-8 weeks when she has had a significant increase in her pelvic and abdominal pain.  Eventually she has had a CT scan which confirms the presence of fibroids and a small amount of endoemtrial fluid which is quite small and normal as a post ablation finding.  On exam she is very tender in the midline and adnexa, my impression this represents an acute fibroid degeneration as the source for her pain, it is not consistent with a hematometra or adnexal pathology by scan. Sine she has signficant lateral pain as well, appropriate management surgically wopuld be to remove the ovaries as well.  Since this is my first time seeing pt i told her and her family I wanted her to think over her options, which really just include definitive surgical management with TAHBSO due to the intensity abruptness and location of her pain and since she has already had an endometrial ablation.   Objective Abdomen soft tender, severe, lower pelvic No CMT uterus adnexa cannot be palpated directly due to guarding  Pertinent ROS No burning with urination, frequency or urgency No nausea, vomiting or diarrhea Nor fever chills or other constitutional symptoms   Labs or studies Labs and scans and records have been reviewed    Impression Diagnoses this Encounter::   ICD-9-CM ICD-10-CM   1. Uterine leiomyoma,  unspecified location 218.9 D25.9   2. Pelvic pain in female 625.9 R10.2   3. S/P endometrial ablation V45.89 Z98.89     Established relevant diagnosis(es):   Plan/Recommendations: No orders of the defined types were placed in this encounter.    Labs or Scans Ordered: No orders of the defined types were placed in this encounter.    i have recommended patient have a TAHBSO as stated above due to pain location and severity and exhausting conservative measures, not associated with bleeding  Follow up Pt to decide regarding surgery     Face to face time:  30 minutes  Greater than 50% of the visit time was spent in counseling and coordination of care with the patient.  The summary and outline of the counseling and care coordination is summarized in the note above.   All questions were answered.   Past Medical History  Diagnosis Date  . Chronic abdominal pain   . Anxiety     Past Surgical History  Procedure Laterality Date  . Uterine ablation    . Cesarean section    . Tonsillectomy      OB History    No data available      No Known Allergies  Social History   Social History  . Marital Status: Married    Spouse Name: N/A  . Number of Children: N/A  . Years of Education: N/A   Social History Main Topics  . Smoking status: Never Smoker   . Smokeless  tobacco: Never Used  . Alcohol Use: Yes     Comment: occasionally  . Drug Use: No  . Sexual Activity: Not Currently    Birth Control/ Protection: Surgical   Other Topics Concern  . None   Social History Narrative    Family History  Problem Relation Age of Onset  . Hypertension Father   . Diabetes Father   . Other Sister     sciatica   . ADD / ADHD Son

## 2015-06-28 NOTE — Telephone Encounter (Signed)
Scheduled, Dawn is handling

## 2015-06-28 NOTE — Patient Instructions (Signed)
Tara Barrett  06/28/2015     @PREFPERIOPPHARMACY @   Your procedure is scheduled on 07/03/2015  Report to [name] at 7:30 A.M.  Call this number if you have problems the morning of surgery:  [phone]   Remember:  Do not eat food or drink liquids after midnight.  Take these medicines the morning of surgery with A SIP OF WATER xanax, fioracet or norco, maxalt, phenergan OR zofran   Do not wear jewelry, make-up or nail polish.  Do not wear lotions, powders, or perfumes.  You may wear deodorant.  Do not shave 48 hours prior to surgery.  Men may shave face and neck.  Do not bring valuables to the hospital.  [facility] is not responsible for any belongings or valuables.  Contacts, dentures or bridgework may not be worn into surgery.  Leave your suitcase in the car.  After surgery it may be brought to your room.  For patients admitted to the hospital, discharge time will be determined by your treatment team.  Patients discharged the day of surgery will not be allowed to drive home.    Please read over the following fact sheets that you were given. Surgical Site Infection Prevention and Anesthesia Post-op Instructions     PATIENT INSTRUCTIONS POST-ANESTHESIA  IMMEDIATELY FOLLOWING SURGERY:  Do not drive or operate machinery for the first twenty four hours after surgery.  Do not make any important decisions for twenty four hours after surgery or while taking narcotic pain medications or sedatives.  If you develop intractable nausea and vomiting or a severe headache please notify your doctor immediately.  FOLLOW-UP:  Please make an appointment with your surgeon as instructed. You do not need to follow up with anesthesia unless specifically instructed to do so.  WOUND CARE INSTRUCTIONS (if applicable):  Keep a dry clean dressing on the anesthesia/puncture wound site if there is drainage.  Once the wound has quit draining you may leave it open to air.  Generally you should  leave the bandage intact for twenty four hours unless there is drainage.  If the epidural site drains for more than 36-48 hours please call the anesthesia department.  QUESTIONS?:  Please feel free to call your physician or the hospital operator if you have any questions, and they will be happy to assist you.      Abdominal Hysterectomy Abdominal hysterectomy is a surgical procedure to remove your womb (uterus). Your uterus is the muscular organ that contains a developing baby. This surgery is done for many reasons. You may need an abdominal hysterectomy if you have cancer, growths (tumors), long-term pain, or bleeding. You may also have this procedure if your uterus has slipped down into your vagina (uterine prolapse). Depending on why you need an abdominal hysterectomy, you may also have other reproductive organs removed. These could include the part of your vagina that connects with your uterus (cervix), the organs that make eggs (ovaries), and the tubes that connect the ovaries to the uterus (fallopian tubes). LET [facility] CARE PROVIDER KNOW ABOUT:   Any allergies you have.  All medicines you are taking, including vitamins, herbs, eye drops, creams, and over-the-counter medicines.  Previous problems you or members of your family have had with the use of anesthetics.  Any blood disorders you have.  Previous surgeries you have had.  Medical conditions you have. RISKS AND COMPLICATIONS Generally, this is a safe procedure. However, as with any procedure, problems can occur. Infection is the most common problem  after an abdominal hysterectomy. Other possible problems include:  Bleeding.  Formation of blood clots that may break free and travel to your lungs.  Injury to other organs near your uterus.  Nerve injury causing nerve pain.  Decreased interest in sex or pain during sexual intercourse. BEFORE THE PROCEDURE  Abdominal hysterectomy is a major surgical procedure. It can  affect the way you feel about yourself. Talk to your health care provider about the physical and emotional changes hysterectomy may cause.  You may need to have blood work and X-rays done before surgery.  Quit smoking if you smoke. Ask your health care provider for help if you are struggling to quit.  Stop taking medicines that thin your blood as directed by your health care provider.  You may be instructed to take antibiotic medicines or laxatives before surgery.  Do not eat or drink anything for 6-8 hours before surgery.  Take your regular medicines with a small sip of water.  Bathe or shower the night or morning before surgery. PROCEDURE  Abdominal hysterectomy is done in the operating room at the hospital.  In most cases, you will be given a medicine that makes you go to sleep (general anesthetic).  The surgeon will make a cut (incision) through the skin in your lower belly.  The incision may be about 5-7 inches long. It may go side-to-side or up-and-down.  The surgeon will move aside the body tissue that covers your uterus. The surgeon will then carefully take out your uterus along with any of your other reproductive organs that need to be removed.  Bleeding will be controlled with clamps or sutures.  The surgeon will close your incision with sutures or metal clips. AFTER THE PROCEDURE  You will have some pain immediately after the procedure.  You will be given pain medicine in the recovery room.  You will be taken to your hospital room when you have recovered from the anesthesia.  You may need to stay in the hospital for 2-5 days.  You will be given instructions for recovery at home. Document Released: 09/19/2013 Document Reviewed: 09/19/2013 [facility] Patient Information 2015 [place], [place]. This information is not intended to replace advice given to you by your health care provider. Make sure you discuss any questions you have with your health care provider.

## 2015-06-29 LAB — TYPE AND SCREEN
ABO/RH(D): A POS
ANTIBODY SCREEN: NEGATIVE

## 2015-07-03 ENCOUNTER — Inpatient Hospital Stay (HOSPITAL_COMMUNITY): Payer: BLUE CROSS/BLUE SHIELD | Admitting: Anesthesiology

## 2015-07-03 ENCOUNTER — Observation Stay (HOSPITAL_COMMUNITY)
Admission: RE | Admit: 2015-07-03 | Discharge: 2015-07-04 | Disposition: A | Discharge status: Home / Self Care | Payer: BLUE CROSS/BLUE SHIELD | Source: Ambulatory Visit | Attending: Obstetrics & Gynecology | Admitting: Obstetrics & Gynecology

## 2015-07-03 ENCOUNTER — Encounter (HOSPITAL_COMMUNITY): Payer: Self-pay | Admitting: *Deleted

## 2015-07-03 ENCOUNTER — Encounter (HOSPITAL_COMMUNITY)
Admission: RE | Disposition: A | Discharge status: Home / Self Care | Payer: Self-pay | Source: Ambulatory Visit | Attending: Obstetrics & Gynecology

## 2015-07-03 DIAGNOSIS — D251 Intramural leiomyoma of uterus: Secondary | ICD-10-CM | POA: Diagnosis not present

## 2015-07-03 DIAGNOSIS — N879 Dysplasia of cervix uteri, unspecified: Secondary | ICD-10-CM | POA: Insufficient documentation

## 2015-07-03 DIAGNOSIS — Z79899 Other long term (current) drug therapy: Secondary | ICD-10-CM | POA: Diagnosis not present

## 2015-07-03 DIAGNOSIS — N72 Inflammatory disease of cervix uteri: Secondary | ICD-10-CM | POA: Diagnosis not present

## 2015-07-03 DIAGNOSIS — Z90710 Acquired absence of both cervix and uterus: Secondary | ICD-10-CM

## 2015-07-03 DIAGNOSIS — F419 Anxiety disorder, unspecified: Secondary | ICD-10-CM | POA: Insufficient documentation

## 2015-07-03 DIAGNOSIS — I10 Essential (primary) hypertension: Secondary | ICD-10-CM | POA: Diagnosis not present

## 2015-07-03 DIAGNOSIS — N838 Other noninflammatory disorders of ovary, fallopian tube and broad ligament: Secondary | ICD-10-CM | POA: Diagnosis not present

## 2015-07-03 DIAGNOSIS — Z90722 Acquired absence of ovaries, bilateral: Secondary | ICD-10-CM

## 2015-07-03 DIAGNOSIS — Z9079 Acquired absence of other genital organ(s): Secondary | ICD-10-CM

## 2015-07-03 DIAGNOSIS — R102 Pelvic and perineal pain: Secondary | ICD-10-CM | POA: Diagnosis present

## 2015-07-03 HISTORY — PX: SALPINGOOPHORECTOMY: SHX82

## 2015-07-03 HISTORY — PX: ABDOMINAL HYSTERECTOMY: SHX81

## 2015-07-03 SURGERY — HYSTERECTOMY, ABDOMINAL
Anesthesia: General | Site: Abdomen

## 2015-07-03 MED ORDER — GLYCOPYRROLATE 0.2 MG/ML IJ SOLN
INTRAMUSCULAR | Status: DC | PRN
  Administered 2015-07-03: .6 mg via INTRAVENOUS

## 2015-07-03 MED ORDER — PROPOFOL 10 MG/ML IV BOLUS
INTRAVENOUS | Status: AC
  Filled 2015-07-03: qty 20

## 2015-07-03 MED ORDER — SODIUM CHLORIDE 0.9 % IV SOLN
8.0000 mg | Freq: Four times a day (QID) | INTRAVENOUS | Status: DC | PRN
  Filled 2015-07-03: qty 4

## 2015-07-03 MED ORDER — 0.9 % SODIUM CHLORIDE (POUR BTL) OPTIME
TOPICAL | Status: DC | PRN
  Administered 2015-07-03: 2000 mL
  Administered 2015-07-03: 1000 mL

## 2015-07-03 MED ORDER — ROCURONIUM BROMIDE 50 MG/5ML IV SOLN
INTRAVENOUS | Status: AC
  Filled 2015-07-03: qty 1

## 2015-07-03 MED ORDER — BISACODYL 10 MG RE SUPP: 10.0000 mg | Freq: Every day | RECTAL | Status: DC | PRN

## 2015-07-03 MED ORDER — BUPIVACAINE LIPOSOME 1.3 % IJ SUSP
20.0000 mL | Freq: Once | INTRAMUSCULAR | Status: DC
  Filled 2015-07-03: qty 20

## 2015-07-03 MED ORDER — BUPIVACAINE LIPOSOME 1.3 % IJ SUSP
INTRAMUSCULAR | Status: DC | PRN
  Administered 2015-07-03: 20 mL

## 2015-07-03 MED ORDER — KETOROLAC TROMETHAMINE 30 MG/ML IJ SOLN
30.0000 mg | Freq: Once | INTRAMUSCULAR | Status: AC
  Administered 2015-07-03: 30 mg via INTRAVENOUS
  Filled 2015-07-03: qty 1

## 2015-07-03 MED ORDER — FENTANYL CITRATE (PF) 100 MCG/2ML IJ SOLN
INTRAMUSCULAR | Status: AC
  Filled 2015-07-03: qty 4

## 2015-07-03 MED ORDER — ROCURONIUM BROMIDE 100 MG/10ML IV SOLN
INTRAVENOUS | Status: DC | PRN
  Administered 2015-07-03: 35 mg via INTRAVENOUS
  Administered 2015-07-03 (×2): 5 mg via INTRAVENOUS

## 2015-07-03 MED ORDER — HYDROMORPHONE HCL 1 MG/ML IJ SOLN
INTRAMUSCULAR | Status: AC
  Filled 2015-07-03: qty 1

## 2015-07-03 MED ORDER — FENTANYL CITRATE (PF) 100 MCG/2ML IJ SOLN
INTRAMUSCULAR | Status: AC
  Filled 2015-07-03: qty 2

## 2015-07-03 MED ORDER — SENNOSIDES-DOCUSATE SODIUM 8.6-50 MG PO TABS: 1.0000 | ORAL_TABLET | Freq: Every evening | ORAL | Status: DC | PRN

## 2015-07-03 MED ORDER — NEOSTIGMINE METHYLSULFATE 10 MG/10ML IV SOLN
INTRAVENOUS | Status: DC | PRN
  Administered 2015-07-03: 3 mg via INTRAVENOUS

## 2015-07-03 MED ORDER — MIDAZOLAM HCL 2 MG/2ML IJ SOLN
1.0000 mg | INTRAMUSCULAR | Status: DC | PRN
  Administered 2015-07-03 (×2): 2 mg via INTRAVENOUS
  Filled 2015-07-03: qty 2

## 2015-07-03 MED ORDER — PROPOFOL 10 MG/ML IV BOLUS
INTRAVENOUS | Status: DC | PRN
  Administered 2015-07-03: 150 mg via INTRAVENOUS

## 2015-07-03 MED ORDER — PROMETHAZINE HCL 25 MG/ML IJ SOLN
6.2500 mg | INTRAMUSCULAR | Status: DC | PRN
  Administered 2015-07-03: 6.25 mg via INTRAVENOUS

## 2015-07-03 MED ORDER — ONDANSETRON HCL 4 MG/2ML IJ SOLN
4.0000 mg | Freq: Once | INTRAMUSCULAR | Status: AC
  Administered 2015-07-03: 4 mg via INTRAVENOUS

## 2015-07-03 MED ORDER — FENTANYL CITRATE (PF) 250 MCG/5ML IJ SOLN
INTRAMUSCULAR | Status: DC | PRN
  Administered 2015-07-03 (×2): 50 ug via INTRAVENOUS
  Administered 2015-07-03 (×2): 25 ug via INTRAVENOUS
  Administered 2015-07-03 (×4): 50 ug via INTRAVENOUS

## 2015-07-03 MED ORDER — GLYCOPYRROLATE 0.2 MG/ML IJ SOLN
INTRAMUSCULAR | Status: AC
  Filled 2015-07-03: qty 2

## 2015-07-03 MED ORDER — ALPRAZOLAM 1 MG PO TABS
1.0000 mg | ORAL_TABLET | Freq: Two times a day (BID) | ORAL | Status: DC | PRN
  Administered 2015-07-04: 1 mg via ORAL
  Filled 2015-07-03: qty 1

## 2015-07-03 MED ORDER — OXYCODONE-ACETAMINOPHEN 5-325 MG PO TABS
1.0000 | ORAL_TABLET | ORAL | Status: DC | PRN
  Administered 2015-07-03 – 2015-07-04 (×6): 2 via ORAL
  Filled 2015-07-03 (×6): qty 2

## 2015-07-03 MED ORDER — BUPIVACAINE LIPOSOME 1.3 % IJ SUSP
INTRAMUSCULAR | Status: AC
  Filled 2015-07-03: qty 20

## 2015-07-03 MED ORDER — FENTANYL CITRATE (PF) 100 MCG/2ML IJ SOLN
25.0000 ug | INTRAMUSCULAR | Status: DC | PRN
  Administered 2015-07-03 (×4): 50 ug via INTRAVENOUS
  Filled 2015-07-03: qty 2

## 2015-07-03 MED ORDER — BENAZEPRIL HCL 10 MG PO TABS
20.0000 mg | ORAL_TABLET | Freq: Every day | ORAL | Status: DC
  Filled 2015-07-03: qty 2

## 2015-07-03 MED ORDER — ONDANSETRON HCL 4 MG/2ML IJ SOLN: 4.0000 mg | Freq: Once | INTRAMUSCULAR | Status: DC | PRN

## 2015-07-03 MED ORDER — CEFAZOLIN SODIUM-DEXTROSE 2-3 GM-% IV SOLR
2.0000 g | INTRAVENOUS | Status: AC
  Administered 2015-07-03: 2 g via INTRAVENOUS
  Filled 2015-07-03: qty 50

## 2015-07-03 MED ORDER — FENTANYL CITRATE (PF) 250 MCG/5ML IJ SOLN
INTRAMUSCULAR | Status: AC
Start: 2015-07-03 — End: 2015-07-03
  Filled 2015-07-03: qty 25

## 2015-07-03 MED ORDER — ZOLPIDEM TARTRATE 5 MG PO TABS: 5.0000 mg | ORAL_TABLET | Freq: Every evening | ORAL | Status: DC | PRN

## 2015-07-03 MED ORDER — HYDROCHLOROTHIAZIDE 12.5 MG PO CAPS
12.5000 mg | ORAL_CAPSULE | Freq: Every day | ORAL | Status: DC
  Filled 2015-07-03: qty 1

## 2015-07-03 MED ORDER — HYDROMORPHONE HCL 1 MG/ML IJ SOLN
1.0000 mg | INTRAMUSCULAR | Status: DC | PRN
  Administered 2015-07-03: 1 mg via INTRAVENOUS
  Administered 2015-07-03: 2 mg via INTRAVENOUS
  Administered 2015-07-03 – 2015-07-04 (×4): 1 mg via INTRAVENOUS
  Filled 2015-07-03 (×6): qty 1
  Filled 2015-07-03 (×2): qty 2
  Filled 2015-07-03: qty 1

## 2015-07-03 MED ORDER — ONDANSETRON HCL 4 MG PO TABS
8.0000 mg | ORAL_TABLET | Freq: Four times a day (QID) | ORAL | Status: DC | PRN
Start: 2015-07-03 — End: 2015-07-04
  Administered 2015-07-03 – 2015-07-04 (×4): 8 mg via ORAL
  Filled 2015-07-03 (×4): qty 2

## 2015-07-03 MED ORDER — BENAZEPRIL-HYDROCHLOROTHIAZIDE 20-12.5 MG PO TABS: 1.0000 | ORAL_TABLET | Freq: Every day | ORAL | Status: DC

## 2015-07-03 MED ORDER — LACTATED RINGERS IV SOLN
INTRAVENOUS | Status: DC
  Administered 2015-07-03: 1000 mL via INTRAVENOUS
  Administered 2015-07-03 (×2): via INTRAVENOUS

## 2015-07-03 MED ORDER — DOCUSATE SODIUM 100 MG PO CAPS
100.0000 mg | ORAL_CAPSULE | Freq: Two times a day (BID) | ORAL | Status: DC
  Administered 2015-07-03 – 2015-07-04 (×3): 100 mg via ORAL
  Filled 2015-07-03 (×3): qty 1

## 2015-07-03 MED ORDER — MIDAZOLAM HCL 2 MG/2ML IJ SOLN
INTRAMUSCULAR | Status: AC
  Filled 2015-07-03: qty 2

## 2015-07-03 MED ORDER — PROMETHAZINE HCL 25 MG/ML IJ SOLN
INTRAMUSCULAR | Status: AC
Start: 2015-07-03 — End: 2015-07-03
  Filled 2015-07-03: qty 1

## 2015-07-03 MED ORDER — ONDANSETRON HCL 4 MG/2ML IJ SOLN
INTRAMUSCULAR | Status: AC
  Filled 2015-07-03: qty 2

## 2015-07-03 MED ORDER — LIDOCAINE HCL (PF) 1 % IJ SOLN
INTRAMUSCULAR | Status: AC
  Filled 2015-07-03: qty 5

## 2015-07-03 MED ORDER — SODIUM CHLORIDE 0.9 % IJ SOLN
INTRAMUSCULAR | Status: AC
Start: 2015-07-03 — End: 2015-07-03
  Filled 2015-07-03: qty 3

## 2015-07-03 MED ORDER — HYDROMORPHONE HCL 1 MG/ML IJ SOLN
0.2500 mg | INTRAMUSCULAR | Status: DC | PRN
  Administered 2015-07-03 (×4): 0.5 mg via INTRAVENOUS

## 2015-07-03 MED ORDER — KCL IN DEXTROSE-NACL 20-5-0.45 MEQ/L-%-% IV SOLN
INTRAVENOUS | Status: DC
Start: 2015-07-03 — End: 2015-07-04
  Administered 2015-07-03 – 2015-07-04 (×2): via INTRAVENOUS

## 2015-07-03 MED ORDER — LIDOCAINE HCL (CARDIAC) 20 MG/ML IV SOLN
INTRAVENOUS | Status: DC | PRN
  Administered 2015-07-03: 50 mg via INTRAVENOUS

## 2015-07-03 MED ORDER — HYDROMORPHONE HCL 1 MG/ML IJ SOLN
INTRAMUSCULAR | Status: AC
Start: 2015-07-03 — End: 2015-07-03
  Filled 2015-07-03: qty 1

## 2015-07-03 SURGICAL SUPPLY — 47 items
APPLIER CLIP 13 LRG OPEN (CLIP)
BAG HAMPER (MISCELLANEOUS) ×4 IMPLANT
CELLS DAT CNTRL 66122 CELL SVR (MISCELLANEOUS) ×2 IMPLANT
CLIP APPLIE 13 LRG OPEN (CLIP) IMPLANT
CLOTH BEACON ORANGE TIMEOUT ST (SAFETY) ×4 IMPLANT
COVER LIGHT HANDLE STERIS (MISCELLANEOUS) ×8 IMPLANT
DRAPE WARM FLUID 44X44 (DRAPE) ×4 IMPLANT
DRSG OPSITE POSTOP 4X8 (GAUZE/BANDAGES/DRESSINGS) ×4 IMPLANT
DRSG TELFA 3X8 NADH (GAUZE/BANDAGES/DRESSINGS) ×4 IMPLANT
ELECT REM PT RETURN 9FT ADLT (ELECTROSURGICAL) ×4
ELECTRODE REM PT RTRN 9FT ADLT (ELECTROSURGICAL) ×2 IMPLANT
FORMALIN 10 PREFIL 480ML (MISCELLANEOUS) ×4 IMPLANT
GLOVE BIOGEL PI IND STRL 8 (GLOVE) ×2 IMPLANT
GLOVE BIOGEL PI INDICATOR 8 (GLOVE) ×2
GLOVE ECLIPSE 6.5 STRL STRAW (GLOVE) ×8 IMPLANT
GLOVE ECLIPSE 8.0 STRL XLNG CF (GLOVE) ×4 IMPLANT
GLOVE INDICATOR 7.0 STRL GRN (GLOVE) ×8 IMPLANT
GOWN STRL REUS W/TWL LRG LVL3 (GOWN DISPOSABLE) ×8 IMPLANT
GOWN STRL REUS W/TWL XL LVL3 (GOWN DISPOSABLE) ×4 IMPLANT
INST SET MAJOR GENERAL (KITS) ×4 IMPLANT
KIT ROOM TURNOVER APOR (KITS) ×4 IMPLANT
LIQUID BAND (GAUZE/BANDAGES/DRESSINGS) ×4 IMPLANT
MANIFOLD NEPTUNE II (INSTRUMENTS) ×4 IMPLANT
NEEDLE HYPO 21X1.5 SAFETY (NEEDLE) ×4 IMPLANT
NS IRRIG 1000ML POUR BTL (IV SOLUTION) ×12 IMPLANT
PACK ABDOMINAL MAJOR (CUSTOM PROCEDURE TRAY) ×4 IMPLANT
PAD ABD 5X9 TENDERSORB (GAUZE/BANDAGES/DRESSINGS) ×8 IMPLANT
PAD ARMBOARD 7.5X6 YLW CONV (MISCELLANEOUS) ×4 IMPLANT
RETRACTOR WND ALEXIS 25 LRG (MISCELLANEOUS) IMPLANT
RTRCTR WOUND ALEXIS 18CM MED (MISCELLANEOUS) ×4
RTRCTR WOUND ALEXIS 25CM LRG (MISCELLANEOUS)
SET BASIN LINEN APH (SET/KITS/TRAYS/PACK) ×4 IMPLANT
SPONGE GAUZE 4X4 12PLY (GAUZE/BANDAGES/DRESSINGS) ×4 IMPLANT
STAPLER VISISTAT 35W (STAPLE) IMPLANT
SUT CHROMIC 0 CT 1 (SUTURE) ×4 IMPLANT
SUT MON AB 3-0 SH 27 (SUTURE) ×12 IMPLANT
SUT PLAIN 2 0 XLH (SUTURE) IMPLANT
SUT PLAIN CT 1/2CIR 2-0 27IN (SUTURE) ×8 IMPLANT
SUT VIC AB 0 CT1 27 (SUTURE) ×8
SUT VIC AB 0 CT1 27XBRD ANTBC (SUTURE) ×2 IMPLANT
SUT VIC AB 0 CT1 27XCR 8 STRN (SUTURE) ×6 IMPLANT
SUT VIC AB 0 CTX 36 (SUTURE) ×2
SUT VIC AB 0 CTX36XBRD ANTBCTR (SUTURE) ×2 IMPLANT
SUT VICRYL 3 0 (SUTURE) ×4 IMPLANT
SYR 20CC LL (SYRINGE) ×4 IMPLANT
TOWEL BLUE STERILE X RAY DET (MISCELLANEOUS) IMPLANT
TRAY FOLEY CATH SILVER 16FR (SET/KITS/TRAYS/PACK) ×4 IMPLANT

## 2015-07-03 NOTE — Anesthesia Procedure Notes (Signed)
Procedure Name: Intubation Date/Time: 07/03/2015 11:07 AM Performed by: Tressie Stalker E Pre-anesthesia Checklist: Patient identified, Patient being monitored, Timeout performed, Emergency Drugs available and Suction available Patient Re-evaluated:Patient Re-evaluated prior to inductionOxygen Delivery Method: Circle System Utilized Preoxygenation: Pre-oxygenation with 100% oxygen Intubation Type: IV induction Ventilation: Mask ventilation without difficulty Laryngoscope Size: Mac and 3 Grade View: Grade I Tube type: Oral Tube size: 7.0 mm Number of attempts: 1 Airway Equipment and Method: Stylet Placement Confirmation: ETT inserted through vocal cords under direct vision,  positive ETCO2 and breath sounds checked- equal and bilateral Secured at: 22 cm Tube secured with: Tape Dental Injury: Teeth and Oropharynx as per pre-operative assessment

## 2015-07-03 NOTE — Progress Notes (Signed)
From OR. Arousing. Moaning/groaning. Crying. Oriented to place per nurse. Reassurance given. Pain med given per anesthesia.

## 2015-07-03 NOTE — Transfer of Care (Signed)
Immediate Anesthesia Transfer of Care Note  Patient: Tara Barrett  Procedure(s) Performed: Procedure(s): HYSTERECTOMY ABDOMINAL (N/A) BILATERAL SALPINGO OOPHORECTOMY (Bilateral)  Patient Location: PACU  Anesthesia Type:General  Level of Consciousness: awake and alert   Airway & Oxygen Therapy: Patient Spontanous Breathing and Patient connected to face mask oxygen  Post-op Assessment: Report given to RN and Post -op Vital signs reviewed and stable  Post vital signs: Reviewed and stable  Last Vitals:  Filed Vitals:   07/03/15 1045  BP: 108/73  Temp:   Resp: 10    Complications: No apparent anesthesia complications

## 2015-07-03 NOTE — Progress Notes (Signed)
Awake. Crying. Rates pain 10. Continues c/o postop abd pain. Med as noted.

## 2015-07-03 NOTE — Anesthesia Preprocedure Evaluation (Addendum)
Anesthesia Evaluation  Patient identified by MRN, date of birth, ID band Patient awake    Reviewed: Allergy & Precautions, NPO status , Patient's Chart, lab work & pertinent test results, Unable to perform ROS - Chart review only  Airway Mallampati: I  TM Distance: >3 FB Neck ROM: Full    Dental  (+) Teeth Intact, Dental Advisory Given,    Pulmonary neg pulmonary ROS,    Pulmonary exam normal        Cardiovascular Exercise Tolerance: Good hypertension, Pt. on medications Normal cardiovascular exam     Neuro/Psych Anxiety negative neurological ROS     GI/Hepatic negative GI ROS, Neg liver ROS,   Endo/Other  negative endocrine ROS  Renal/GU   negative genitourinary   Musculoskeletal negative musculoskeletal ROS (+)   Abdominal Normal abdominal exam  (+)   Peds  Hematology negative hematology ROS (+)   Anesthesia Other Findings Anesthetic Complication:  Hypotension after placement of SAB for elective c-section.  Hypotension treated without further complications.  Reproductive/Obstetrics negative OB ROS                             Anesthesia Physical Anesthesia Plan  ASA: II  Anesthesia Plan:    Post-op Pain Management:    Induction:   Airway Management Planned:   Additional Equipment:   Intra-op Plan:   Post-operative Plan:   Informed Consent:   Plan Discussed with:   Anesthesia Plan Comments:         Anesthesia Quick Evaluation

## 2015-07-03 NOTE — H&P (Signed)
Preoperative History and Physical  Tara Barrett is a 39 y.o. No obstetric history on file. with Patient's last menstrual period was 06/28/2015. admitted for a TAH BSO.  See my note from her visit:  .   Signed Florian Buff, MD 06/28/2015 12:44 PM    Progress Notes    Expand All Collapse All   Patient ID: Tara Barrett, female DOB: 1975/11/27, 39 y.o. MRN: [id number] Chief Complaint  Patient presents with  . Advice Only    Discuss hysterectomy    Blood pressure 118/70, height 5\' 9"  (1.753 m), weight 219 lb (99.338 kg).  39 y.o. No obstetric history on file. No LMP recorded. Patient has had an ablation.   Subjective I am seeing this patient as a referral from Dr Zada Girt due to worsening and unrelenting pelvic lower abdominal pain. She had an endometrial ablation 2+ years ago for menometrorrhagia and dysmenorrhea. She had known fibroids at the time. She overall had done pretty well until the last 6-8 weeks when she has had a significant increase in her pelvic and abdominal pain. Eventually she has had a CT scan which confirms the presence of fibroids and a small amount of endoemtrial fluid which is quite small and normal as a post ablation finding. On exam she is very tender in the midline and adnexa, my impression this represents an acute fibroid degeneration as the source for her pain, it is not consistent with a hematometra or adnexal pathology by scan. Sine she has signficant lateral pain as well, appropriate management surgically wopuld be to remove the ovaries as well.  Since this is my first time seeing pt i told her and her family I wanted her to think over her options, which really just include definitive surgical management with TAHBSO due to the intensity abruptness and location of her pain and since she has already had an endometrial ablation.   Objective Abdomen soft tender, severe, lower pelvic No CMT uterus adnexa cannot be palpated directly due to  guarding  Pertinent ROS No burning with urination, frequency or urgency No nausea, vomiting or diarrhea Nor fever chills or other constitutional symptoms   Labs or studies Labs and scans and records have been reviewed    Impression Diagnoses this Encounter::   ICD-9-CM ICD-10-CM   1. Uterine leiomyoma, unspecified location 218.9 D25.9   2. Pelvic pain in female 625.9 R10.2   3. S/P endometrial ablation V45.89 Z98.89     Established relevant diagnosis(es):   Plan/Recommendations: No orders of the defined types were placed in this encounter.   Labs or Scans Ordered: No orders of the defined types were placed in this encounter.   i have recommended patient have a TAHBSO as stated above due to pain location and severity and exhausting conservative measures, not associated with bleeding       PMH:    Past Medical History  Diagnosis Date  . Chronic abdominal pain   . Anxiety   . Complication of anesthesia 10/30/1939    Unknown complication - [facility] during C section    PSH:     Past Surgical History  Procedure Laterality Date  . Uterine ablation    . Cesarean section    . Tonsillectomy    . Cervical cerclage      POb/GynH:      OB History    No data available      SH:   Social History  Substance Use Topics  . Smoking status: Never Smoker   .  Smokeless tobacco: Never Used  . Alcohol Use: Yes     Comment: occasionally    FH:    Family History  Problem Relation Age of Onset  . Hypertension Father   . Diabetes Father   . Other Sister     sciatica   . ADD / ADHD Son      Allergies: No Known Allergies  Medications:       Current facility-administered medications:  .  bupivacaine liposome (EXPAREL) 1.3 % injection 266 mg, 20 mL, Infiltration, Once, Florian Buff, MD .  ceFAZolin (ANCEF) IVPB 2 g/50 mL premix, 2 g, Intravenous, On Call to OR, Florian Buff, MD .  lactated ringers infusion, , Intravenous,  Continuous, Rusty Aus, MD, Last Rate: 75 mL/hr at 07/03/15 0934 .  midazolam (VERSED) injection 1-2 mg, 1-2 mg, Intravenous, Q5 min PRN, Rusty Aus, MD, 2 mg at 07/03/15 1009  Review of Systems:   Review of Systems  Constitutional: Negative for fever, chills, weight loss, malaise/fatigue and diaphoresis.  HENT: Negative for hearing loss, ear pain, nosebleeds, congestion, sore throat, neck pain, tinnitus and ear discharge.   Eyes: Negative for blurred vision, double vision, photophobia, pain, discharge and redness.  Respiratory: Negative for cough, hemoptysis, sputum production, shortness of breath, wheezing and stridor.   Cardiovascular: Negative for chest pain, palpitations, orthopnea, claudication, leg swelling and PND.  Gastrointestinal: Positive for abdominal pain. Negative for heartburn, nausea, vomiting, diarrhea, constipation, blood in stool and melena.  Genitourinary: Negative for dysuria, urgency, frequency, hematuria and flank pain.  Musculoskeletal: Negative for myalgias, back pain, joint pain and falls.  Skin: Negative for itching and rash.  Neurological: Negative for dizziness, tingling, tremors, sensory change, speech change, focal weakness, seizures, loss of consciousness, weakness and headaches.  Endo/Heme/Allergies: Negative for environmental allergies and polydipsia. Does not bruise/bleed easily.  Psychiatric/Behavioral: Negative for depression, suicidal ideas, hallucinations, memory loss and substance abuse. The patient is not nervous/anxious and does not have insomnia.      PHYSICAL EXAM:  Blood pressure 107/76, temperature 98.3 F (36.8 C), temperature source Oral, resp. rate 13, last menstrual period 06/28/2015, SpO2 99 %.    Vitals reviewed. Constitutional: She is oriented to person, place, and time. She appears well-developed and well-nourished.  HENT:  Head: Normocephalic and atraumatic.  Right Ear: External ear normal.  Left Ear: External ear  normal.  Nose: Nose normal.  Mouth/Throat: Oropharynx is clear and moist.  Eyes: Conjunctivae and EOM are normal. Pupils are equal, round, and reactive to light. Right eye exhibits no discharge. Left eye exhibits no discharge. No scleral icterus.  Neck: Normal range of motion. Neck supple. No tracheal deviation present. No thyromegaly present.  Cardiovascular: Normal rate, regular rhythm, normal heart sounds and intact distal pulses.  Exam reveals no gallop and no friction rub.   No murmur heard. Respiratory: Effort normal and breath sounds normal. No respiratory distress. She has no wheezes. She has no rales. She exhibits no tenderness.  GI: Soft. Bowel sounds are normal. She exhibits no distension and no mass. There is tenderness. There is no rebound and no guarding.  Genitourinary:       Vulva is normal without lesions Vagina is pink moist without discharge Cervix normal in appearance and pap is normal Uterus is enlarged, 10 weeks size and tender Adnexa is negative with normal sized ovaries by sonogram  Musculoskeletal: Normal range of motion. She exhibits no edema and no tenderness.  Neurological: She is alert and oriented to person,  place, and time. She has normal reflexes. She displays normal reflexes. No cranial nerve deficit. She exhibits normal muscle tone. Coordination normal.  Skin: Skin is warm and dry. No rash noted. No erythema. No pallor.  Psychiatric: She has a normal mood and affect. Her behavior is normal. Judgment and thought content normal.    Labs: Results for orders placed or performed during the hospital encounter of 06/28/15 (from the past 336 hour(s))  CBC   Collection Time: 06/28/15  3:13 PM  Result Value Ref Range   WBC 8.2 4.0 - 10.5 K/uL   RBC 4.40 3.87 - 5.11 MIL/uL   Hemoglobin 12.6 12.0 - 15.0 g/dL   HCT 38.0 36.0 - 46.0 %   MCV 86.4 78.0 - 100.0 fL   MCH 28.6 26.0 - 34.0 pg   MCHC 33.2 30.0 - 36.0 g/dL   RDW 13.9 11.5 - 15.5 %   Platelets 358 150 -  400 K/uL  Comprehensive metabolic panel   Collection Time: 06/28/15  3:13 PM  Result Value Ref Range   Sodium 137 135 - 145 mmol/L   Potassium 4.2 3.5 - 5.1 mmol/L   Chloride 106 101 - 111 mmol/L   CO2 22 22 - 32 mmol/L   Glucose, Bld 94 65 - 99 mg/dL   BUN 11 6 - 20 mg/dL   Creatinine, Ser 0.70 0.44 - 1.00 mg/dL   Calcium 9.2 8.9 - 10.3 mg/dL   Total Protein 7.5 6.5 - 8.1 g/dL   Albumin 4.3 3.5 - 5.0 g/dL   AST 67 (H) 15 - 41 U/L   ALT 59 (H) 14 - 54 U/L   Alkaline Phosphatase 45 38 - 126 U/L   Total Bilirubin 0.6 0.3 - 1.2 mg/dL   GFR calc non Af Amer >60 >60 mL/min   GFR calc Af Amer >60 >60 mL/min   Anion gap 9 5 - 15  hCG, quantitative, pregnancy   Collection Time: 06/28/15  3:13 PM  Result Value Ref Range   hCG, Beta Chain, Quant, S <1 <5 mIU/mL  Type and screen   Collection Time: 06/28/15  3:14 PM  Result Value Ref Range   ABO/RH(D) A POS    Antibody Screen NEG    Sample Expiration 07/13/2015   Results for orders placed or performed during the hospital encounter of 06/20/15 (from the past 336 hour(s))  CBC with Differential/Platelet   Collection Time: 06/20/15 10:54 PM  Result Value Ref Range   WBC 5.7 4.0 - 10.5 K/uL   RBC 4.25 3.87 - 5.11 MIL/uL   Hemoglobin 12.1 12.0 - 15.0 g/dL   HCT 36.8 36.0 - 46.0 %   MCV 86.6 78.0 - 100.0 fL   MCH 28.5 26.0 - 34.0 pg   MCHC 32.9 30.0 - 36.0 g/dL   RDW 13.4 11.5 - 15.5 %   Platelets 353 150 - 400 K/uL   Neutrophils Relative % 53 %   Neutro Abs 3.1 1.7 - 7.7 K/uL   Lymphocytes Relative 32 %   Lymphs Abs 1.8 0.7 - 4.0 K/uL   Monocytes Relative 8 %   Monocytes Absolute 0.4 0.1 - 1.0 K/uL   Eosinophils Relative 6 %   Eosinophils Absolute 0.3 0.0 - 0.7 K/uL   Basophils Relative 1 %   Basophils Absolute 0.0 0.0 - 0.1 K/uL  Comprehensive metabolic panel   Collection Time: 06/20/15 10:54 PM  Result Value Ref Range   Sodium 138 135 - 145 mmol/L   Potassium 3.9 3.5 -  5.1 mmol/L   Chloride 102 101 - 111 mmol/L   CO2 26  22 - 32 mmol/L   Glucose, Bld 89 65 - 99 mg/dL   BUN 10 6 - 20 mg/dL   Creatinine, Ser 0.79 0.44 - 1.00 mg/dL   Calcium 9.2 8.9 - 10.3 mg/dL   Total Protein 7.9 6.5 - 8.1 g/dL   Albumin 4.2 3.5 - 5.0 g/dL   AST 25 15 - 41 U/L   ALT 15 14 - 54 U/L   Alkaline Phosphatase 59 38 - 126 U/L   Total Bilirubin 0.7 0.3 - 1.2 mg/dL   GFR calc non Af Amer >60 >60 mL/min   GFR calc Af Amer >60 >60 mL/min   Anion gap 10 5 - 15  Lipase, blood   Collection Time: 06/20/15 10:54 PM  Result Value Ref Range   Lipase 18 (L) 22 - 51 U/L  Urinalysis, Routine w reflex microscopic (not at [facility])   Collection Time: 06/20/15 11:00 PM  Result Value Ref Range   Color, Urine YELLOW YELLOW   APPearance CLEAR CLEAR   Specific Gravity, Urine 1.015 1.005 - 1.030   pH 7.0 5.0 - 8.0   Glucose, UA NEGATIVE NEGATIVE mg/dL   Hgb urine dipstick NEGATIVE NEGATIVE   Bilirubin Urine NEGATIVE NEGATIVE   Ketones, ur NEGATIVE NEGATIVE mg/dL   Protein, ur NEGATIVE NEGATIVE mg/dL   Urobilinogen, UA 0.2 0.0 - 1.0 mg/dL   Nitrite NEGATIVE NEGATIVE   Leukocytes, UA NEGATIVE NEGATIVE  I-Stat Beta hCG blood, ED ([facility], [facility], [facility] only)   Collection Time: 06/20/15 11:00 PM  Result Value Ref Range   I-stat hCG, quantitative <5.0 <5 mIU/mL   Comment 3            EKG: Orders placed or performed during the hospital encounter of 01/19/14  . EKG 12-Lead  . EKG 12-Lead  . ED EKG  . ED EKG  . EKG    Imaging Studies: Dg Chest 2 View  06/21/2015   CLINICAL DATA:  Shortness of breath, chronic intermittent abdominal pain beginning 2 weeks ago, productive cough with yellow phlegm, headache, dizziness, back pain 01/19/2014  EXAM: CHEST  2 VIEW  COMPARISON:  None.  FINDINGS: Normal heart size, mediastinal contours, and pulmonary vascularity.  Subtle RIGHT upper lobe infiltrate suspicious for pneumonia.  Remaining lungs clear.  No pleural effusion or pneumothorax.  Osseous structures unremarkable.  IMPRESSION: Subtle RIGHT upper lobe  infiltrate suspicious for pneumonia.   Electronically Signed   By: Lavonia Dana M.D.   On: 06/21/2015 00:56   Ct Abdomen Pelvis W Contrast  06/21/2015   CLINICAL DATA:  Chronic intermittent abdominal pain. Now with constant lower abdominal pain onset 2 weeks ago. Nausea and vaginal discharge onset 8 years ago. Productive cough. Shortness of breath. Back pain, headache, dizziness.  EXAM: CT ABDOMEN AND PELVIS WITH CONTRAST  TECHNIQUE: Multidetector CT imaging of the abdomen and pelvis was performed using the standard protocol following bolus administration of intravenous contrast.  CONTRAST:  108mL OMNIPAQUE IOHEXOL 300 MG/ML SOLN, 123mL OMNIPAQUE IOHEXOL 300 MG/ML SOLN  COMPARISON:  None.  FINDINGS: Mild dependent changes in the lung bases.  Examination seems to been obtained in the delayed phase after contrast administration. The liver, spleen, gallbladder, pancreas, adrenal glands, kidneys, abdominal aorta, inferior vena cava, and retroperitoneal lymph nodes are unremarkable. Stomach, small bowel, and colon are not abnormally distended. Stool fills the colon. Mesenteric lymph nodes are not pathologically enlarged. No free air or free fluid  in the abdomen.  Pelvis: Appendix is normal. Uterus is enlarged and there is a cystic collection in the endometrium measuring 2.4 cm diameter. Correlation with pregnancy test is recommended to exclude early pregnancy. Consider ultrasound for evaluation of the endometrium. No abnormal adnexal masses. Bladder wall is not thickened. Surgical clips in the pelvis possibly tubal ligation. No free or loculated pelvic fluid collections. No pelvic mass or lymphadenopathy. No destructive bone lesions.  IMPRESSION: Uterine enlargement with cystic structure appearing in the endometrium. Correlation with pregnancy test is suggested. If pregnancy test is negative, suggest ultrasound for further evaluation of possible endometrial cyst or mass. Necrotic fibroid could also potentially have this  appearance. Otherwise, no acute process demonstrated. No evidence of bowel obstruction or inflammation. Appendix is normal.   Electronically Signed   By: Lucienne Capers M.D.   On: 06/21/2015 00:18      Assessment Pelvic pain chronic, midline and bilateral S/p ablation 2014 amenorrheic  Plan: TAH BSO  Pt understands the risks of surgery including but not limited t  excessive bleeding requiring transfusion or reoperation, post-operative infection requiring prolonged hospitalization or re-hospitalization and antibiotic therapy, and damage to other organs including bladder, bowel, ureters and major vessels.  The patient also understands the alternative treatment options which were discussed in full.  All questions were answered.  Quindarrius Joplin H 07/03/2015 10:47 AM   Vaniyah Lansky H 07/03/2015 10:45 AM

## 2015-07-04 ENCOUNTER — Encounter (HOSPITAL_COMMUNITY): Payer: Self-pay | Admitting: Obstetrics & Gynecology

## 2015-07-04 DIAGNOSIS — N72 Inflammatory disease of cervix uteri: Secondary | ICD-10-CM | POA: Diagnosis not present

## 2015-07-04 LAB — BASIC METABOLIC PANEL
Anion gap: 3 — ABNORMAL LOW (ref 5–15)
BUN: 7 mg/dL (ref 6–20)
CHLORIDE: 105 mmol/L (ref 101–111)
CO2: 27 mmol/L (ref 22–32)
Calcium: 8.1 mg/dL — ABNORMAL LOW (ref 8.9–10.3)
Creatinine, Ser: 0.75 mg/dL (ref 0.44–1.00)
GFR calc Af Amer: >60 mL/min (ref >60)
GFR calc non Af Amer: >60 mL/min (ref >60)
GLUCOSE: 117 mg/dL — AB (ref 65–99)
POTASSIUM: 4 mmol/L (ref 3.5–5.1)
SODIUM: 135 mmol/L (ref 135–145)

## 2015-07-04 LAB — CBC
HEMATOCRIT: 32.8 % — AB (ref 36.0–46.0)
HEMOGLOBIN: 10.5 g/dL — AB (ref 12.0–15.0)
MCH: 28.2 pg (ref 26.0–34.0)
MCHC: 32 g/dL (ref 30.0–36.0)
MCV: 87.9 fL (ref 78.0–100.0)
Platelets: 305 10*3/uL (ref 150–400)
RBC: 3.73 MIL/uL — AB (ref 3.87–5.11)
RDW: 13.7 % (ref 11.5–15.5)
WBC: 6.4 10*3/uL (ref 4.0–10.5)

## 2015-07-04 MED ORDER — OXYCODONE-ACETAMINOPHEN 7.5-325 MG PO TABS: 1.0000 | ORAL_TABLET | Freq: Four times a day (QID) | ORAL | Status: DC | PRN

## 2015-07-04 MED ORDER — KETOROLAC TROMETHAMINE 10 MG PO TABS: 10.0000 mg | ORAL_TABLET | Freq: Three times a day (TID) | ORAL | Status: DC | PRN

## 2015-07-04 MED ORDER — ONDANSETRON HCL 8 MG PO TABS: 8.0000 mg | ORAL_TABLET | Freq: Four times a day (QID) | ORAL | Status: DC | PRN

## 2015-07-04 NOTE — Discharge Instructions (Signed)
Abdominal Hysterectomy, Care After °Refer to this sheet in the next few weeks. These instructions provide you with information on caring for yourself after your procedure. Your health care provider may also give you more specific instructions. Your treatment has been planned according to current medical practices, but problems sometimes occur. Call your health care provider if you have any problems or questions after your procedure.  °WHAT TO EXPECT AFTER THE PROCEDURE °After your procedure, it is typical to have the following: °· Pain. °· Feeling tired. °· Poor appetite. °· Less interest in sex. °It takes 4-6 weeks to recover from this surgery.  °HOME CARE INSTRUCTIONS  °· Take pain medicines only as directed by your health care provider. Do not take over-the-counter pain medicines without checking with your health care provider first.  °· Change your bandage as directed by your health care provider. °· Return to your health care provider to have your sutures taken out. °· Take showers instead of baths for 2-3 weeks. Ask your health care provider when it is safe to start showering.  °· Do not douche, use tampons, or have sexual intercourse for at least 6 weeks or until your health care provider says you can.   °· Follow your health care provider's advice about exercise, lifting, driving, and general activities. °· Get plenty of rest and sleep.   °· Do not lift anything heavier than a gallon of milk (about 10 lb [4.5 kg]) for the first month after surgery. °· You can resume your normal diet if your health care provider says it is okay.   °· Do not drink alcohol until your health care provider says you can.   °· If you are constipated, ask your health care provider if you can take a mild laxative. °· Eating foods high in fiber may also help with constipation. Eat plenty of raw fruits and vegetables, whole grains, and beans. °· Drink enough fluids to keep your urine clear or pale yellow.   °· Try to have someone at  home with you for the first 1-2 weeks to help around the house. °· Keep all follow-up appointments. °SEEK MEDICAL CARE IF:  °· You have chills or fever. °· You have swelling, redness, or pain in the area of your incision that is getting worse.   °· You have pus coming from the incision.   °· You notice a bad smell coming from the incision or bandage.   °· Your incision breaks open.   °· You feel dizzy or light-headed.   °· You have pain or bleeding when you urinate.   °· You have persistent diarrhea.   °· You have persistent nausea and vomiting.   °· You have abnormal vaginal discharge.   °· You have a rash.   °· You have any type of abnormal reaction or develop an allergy to your medicine.   °· Your pain medicine is not helping.   °SEEK IMMEDIATE MEDICAL CARE IF:  °· You have a fever and your symptoms suddenly get worse. °· You have severe abdominal pain. °· You have chest pain. °· You have shortness of breath. °· You faint. °· You have pain, swelling, or redness of your leg. °· You have heavy vaginal bleeding with blood clots. °MAKE SURE YOU: °· Understand these instructions. °· Will watch your condition. °· Will get help right away if you are not doing well or get worse. °  °This information is not intended to replace advice given to you by your health care provider. Make sure you discuss any questions you have with your health care provider. °  °Document   Released: 04/03/2005 Document Revised: 10/05/2014 Document Reviewed: 07/07/2013 °Elsevier Interactive Patient Education ©2016 [employer] ° °

## 2015-07-04 NOTE — Progress Notes (Signed)
Patient up ambulating in hallway at this time. Tolerating well.

## 2015-07-04 NOTE — Progress Notes (Signed)
IV removed. Discharge instructions reviewed with patient. Understanding verbalized. Ready for discharge home 

## 2015-07-04 NOTE — Anesthesia Postprocedure Evaluation (Signed)
  Anesthesia Post-op Note  Patient: Tara Barrett  Procedure(s) Performed: Procedure(s): HYSTERECTOMY ABDOMINAL (N/A) BILATERAL SALPINGO OOPHORECTOMY (Bilateral)  Patient Location: room 326  Anesthesia Type:General  Level of Consciousness: awake, alert , oriented and patient cooperative  Airway and Oxygen Therapy: Patient Spontanous Breathing  Post-op Pain: 5 /10, moderate  Post-op Assessment: Post-op Vital signs reviewed, Patient's Cardiovascular Status Stable, Respiratory Function Stable, Patent Airway, No signs of Nausea or vomiting, Adequate PO intake and Pain level controlled              Post-op Vital Signs: Reviewed and stable  Last Vitals:  Filed Vitals:   07/04/15 1021  BP: 117/68  Pulse: 90  Temp: 36.4 C  Resp: 16    Complications: No apparent anesthesia complications

## 2015-07-04 NOTE — Discharge Summary (Signed)
Physician Discharge Summary  Patient ID: Tara Barrett MRN: [id number] DOB/AGE: 05/21/1976 39 y.o.  Admit date: 07/03/2015 Discharge date: 07/04/2015  Admission Diagnoses:  Discharge Diagnoses:  Active Problems:   S/P total hysterectomy and bilateral salpingo-oophorectomy   Discharged Condition: stable  Hospital Course: unremarkable  Consults: None  Significant Diagnostic Studies:   Treatments: surgery: TAHBSO  Discharge Exam: Blood pressure 117/68, pulse 90, temperature 97.5 F (36.4 C), temperature source Axillary, resp. rate 16, height 5\' 9"  (1.753 m), last menstrual period 06/28/2015, SpO2 98 %. General appearance: alert, cooperative and no distress GI: soft, non-tender; bowel sounds normal; no masses,  no organomegaly Incision/Wound:clean dry intact  Disposition: 01-Home or Self Care  Discharge Instructions    Call MD for:  persistant nausea and vomiting    Complete by:  As directed      Call MD for:  severe uncontrolled pain    Complete by:  As directed      Call MD for:  temperature >100.4    Complete by:  As directed      Diet - low sodium heart healthy    Complete by:  As directed      Driving Restrictions    Complete by:  As directed   No driving for 1 week     Increase activity slowly    Complete by:  As directed      Lifting restrictions    Complete by:  As directed   No more than 10 pounds     No wound care    Complete by:  As directed      Sexual Activity Restrictions    Complete by:  As directed   You're kidding, right?            Medication List    STOP taking these medications        HYDROcodone-acetaminophen 7.5-325 MG tablet  Commonly known as:  NORCO     ibuprofen 200 MG tablet  Commonly known as:  ADVIL,MOTRIN     oxyCODONE-acetaminophen 5-325 MG tablet  Commonly known as:  PERCOCET/ROXICET  Replaced by:  oxyCODONE-acetaminophen 7.5-325 MG tablet      TAKE these medications        ALPRAZolam 1 MG tablet  Commonly known  as:  XANAX  Take 1 mg by mouth 2 (two) times daily as needed for anxiety.     aspirin EC 325 MG tablet  Take 325 mg by mouth every 6 (six) hours as needed for mild pain or moderate pain.     benazepril-hydrochlorthiazide 20-12.5 MG tablet  Commonly known as:  LOTENSIN HCT  Take 1 tablet by mouth daily.     FISH OIL PO  Take 1,500 mg by mouth daily.     ketorolac 10 MG tablet  Commonly known as:  TORADOL  Take 1 tablet (10 mg total) by mouth every 8 (eight) hours as needed.     multivitamin with minerals Tabs tablet  Take 1 tablet by mouth daily.     nystatin 100000 UNIT/ML suspension  Commonly known as:  MYCOSTATIN  Take 5 mLs (500,000 Units total) by mouth 4 (four) times daily. Swish-swallow     ondansetron 4 MG tablet  Commonly known as:  ZOFRAN  Take 1 tablet (4 mg total) by mouth every 6 (six) hours.     ondansetron 8 MG tablet  Commonly known as:  ZOFRAN  Take 1 tablet (8 mg total) by mouth every 6 (six) hours as needed for nausea.  oxyCODONE-acetaminophen 7.5-325 MG tablet  Commonly known as:  PERCOCET  Take 1-2 tablets by mouth every 6 (six) hours as needed.     promethazine 25 MG tablet  Commonly known as:  PHENERGAN  Take 25 mg by mouth 2 (two) times daily as needed for nausea or vomiting.     rizatriptan 10 MG tablet  Commonly known as:  MAXALT  Take 10 mg by mouth 2 (two) times daily as needed for migraine. May repeat in 2 hours if needed           Follow-up Information    Follow up with Florian Buff, MD In 1 week.   Specialties:  Obstetrics and Gynecology, Radiology   Why:  post op visit   Contact information:   [place] [address] [phone]       Signed: Florian Buff 07/04/2015, 12:50 PM

## 2015-07-04 NOTE — Care Management Note (Signed)
Case Management Note  Patient Details  Name: Tara Barrett MRN: [id number] Date of Birth: 05/01/1976  Subjective/Objective:                  Pt admitted from home s/p hysterectomy. Pt lives with her husband and will return home at discharge. Pt is independent with ADl's.  Action/Plan: Anticipate discharge within 24 hours. No CM needs noted.  Expected Discharge Date:                  Expected Discharge Plan:  Home/Self Care  In-House Referral:  NA  Discharge planning Services  CM Consult  Post Acute Care Choice:  NA Choice offered to:  NA  DME Arranged:    DME Agency:     HH Arranged:    HH Agency:     Status of Service:  Completed, signed off  Medicare Important Message Given:    Date Medicare IM Given:    Medicare IM give by:    Date Additional Medicare IM Given:    Additional Medicare Important Message give by:     If discussed at [place] of Stay Meetings, dates discussed:    Additional Comments:  Joylene Draft, RN 07/04/2015, 11:14 AM

## 2015-07-11 ENCOUNTER — Ambulatory Visit (INDEPENDENT_AMBULATORY_CARE_PROVIDER_SITE_OTHER): Payer: BLUE CROSS/BLUE SHIELD | Admitting: Obstetrics & Gynecology

## 2015-07-11 ENCOUNTER — Encounter: Payer: Self-pay | Admitting: Obstetrics & Gynecology

## 2015-07-11 VITALS — BP 130/80 | HR 76 | Wt 214.0 lb

## 2015-07-11 DIAGNOSIS — Z9889 Other specified postprocedural states: Secondary | ICD-10-CM

## 2015-07-11 DIAGNOSIS — Z9079 Acquired absence of other genital organ(s): Principal | ICD-10-CM

## 2015-07-11 DIAGNOSIS — Z90722 Acquired absence of ovaries, bilateral: Secondary | ICD-10-CM

## 2015-07-11 DIAGNOSIS — Z90710 Acquired absence of both cervix and uterus: Secondary | ICD-10-CM

## 2015-07-11 MED ORDER — KETOROLAC TROMETHAMINE 10 MG PO TABS: 10.0000 mg | ORAL_TABLET | Freq: Three times a day (TID) | ORAL | Status: DC | PRN

## 2015-07-11 MED ORDER — ESTRADIOL 1 MG/GM TD GEL: 1.0000 | Freq: Every day | TRANSDERMAL | Status: DC

## 2015-07-11 MED ORDER — OXYCODONE-ACETAMINOPHEN 7.5-325 MG PO TABS: 1.0000 | ORAL_TABLET | Freq: Four times a day (QID) | ORAL | Status: DC | PRN

## 2015-07-11 NOTE — Progress Notes (Signed)
Patient ID: Tara Barrett, female   DOB: Jul 29, 1976, 39 y.o.   MRN: [id number]  HPI: Patient returns for routine postoperative follow-up having undergone TAHBSO on 07/03/2015.  The patient's immediate postoperative recovery has been unremarkable. Since hospital discharge the patient reports normal post op pain, no spotting.   Current Outpatient Prescriptions: ALPRAZolam (XANAX) 1 MG tablet, Take 1 mg by mouth 2 (two) times daily as needed for anxiety., Disp: , Rfl:  ketorolac (TORADOL) 10 MG tablet, Take 1 tablet (10 mg total) by mouth every 8 (eight) hours as needed., Disp: 15 tablet, Rfl: 0 Multiple Vitamin (MULTIVITAMIN WITH MINERALS) TABS tablet, Take 1 tablet by mouth daily., Disp: , Rfl:  Omega-3 Fatty Acids (FISH OIL PO), Take 1,500 mg by mouth daily., Disp: , Rfl:  ondansetron (ZOFRAN) 4 MG tablet, Take 1 tablet (4 mg total) by mouth every 6 (six) hours., Disp: 12 tablet, Rfl: 0 oxyCODONE-acetaminophen (PERCOCET) 7.5-325 MG tablet, Take 1-2 tablets by mouth every 6 (six) hours as needed., Disp: 30 tablet, Rfl: 0 promethazine (PHENERGAN) 25 MG tablet, Take 25 mg by mouth 2 (two) times daily as needed for nausea or vomiting., Disp: , Rfl:  rizatriptan (MAXALT) 10 MG tablet, Take 10 mg by mouth 2 (two) times daily as needed for migraine. May repeat in 2 hours if needed, Disp: , Rfl:  aspirin EC 325 MG tablet, Take 325 mg by mouth every 6 (six) hours as needed for mild pain or moderate pain., Disp: , Rfl:  benazepril-hydrochlorthiazide (LOTENSIN HCT) 20-12.5 MG tablet, Take 1 tablet by mouth daily., Disp: , Rfl: 0 Estradiol 1 MG/GM GEL, Place 1 packet onto the skin daily., Disp: 1 g, Rfl: 11 nystatin (MYCOSTATIN) 100000 UNIT/ML suspension, Take 5 mLs (500,000 Units total) by mouth 4 (four) times daily. Swish-swallow (Patient not taking: Reported on 07/11/2015), Disp: 180 mL, Rfl: 0 ondansetron (ZOFRAN) 8 MG tablet, Take 1 tablet (8 mg total) by mouth every 6 (six) hours as needed for nausea.  (Patient not taking: Reported on 07/11/2015), Disp: 20 tablet, Rfl: 0 [DISCONTINUED] phentermine (ADIPEX-P) 37.5 MG tablet, Take 37.5 mg by mouth daily before breakfast., Disp: , Rfl:   No current facility-administered medications for this visit.    Blood pressure 130/80, pulse 76, weight 214 lb (97.07 kg), last menstrual period 06/28/2015.  Physical Exam: Incision clean dry intact Abdomen soft normal post op surgical glue in place  Diagnostic Tests:   Pathology: Reviewed and benign  Impression: S/P TAHBSO  Plan:   Follow up: 4  weeks  Florian Buff, MD

## 2015-07-15 ENCOUNTER — Telehealth: Payer: Self-pay | Admitting: Obstetrics & Gynecology

## 2015-07-15 NOTE — Telephone Encounter (Signed)
Clarified order with [facility], [place], [place] for Estradiol. Verbal order by Dr.Eure for Divigel 1mg /gm gel, place 1 packet onto the skin daily, 30 day supply, 11 refills.

## 2015-07-16 ENCOUNTER — Telehealth: Payer: Self-pay | Admitting: Obstetrics & Gynecology

## 2015-07-16 NOTE — Telephone Encounter (Signed)
Ok just start the hormones as soon as she gets them

## 2015-07-22 ENCOUNTER — Telehealth: Payer: Self-pay | Admitting: Obstetrics & Gynecology

## 2015-07-22 NOTE — Telephone Encounter (Signed)
Pt c/o yellowish drainage at incision site, increased pain, no fever, redness or heat at incision site. Pt was given an appt for tomorrow am for evaluation with Dr. Elonda Husky.

## 2015-07-23 ENCOUNTER — Encounter: Payer: Self-pay | Admitting: Obstetrics & Gynecology

## 2015-07-23 ENCOUNTER — Ambulatory Visit (INDEPENDENT_AMBULATORY_CARE_PROVIDER_SITE_OTHER): Payer: BLUE CROSS/BLUE SHIELD | Admitting: Obstetrics & Gynecology

## 2015-07-23 VITALS — BP 130/78 | Ht 69.0 in | Wt 215.0 lb

## 2015-07-23 DIAGNOSIS — Z9079 Acquired absence of other genital organ(s): Principal | ICD-10-CM

## 2015-07-23 DIAGNOSIS — Z90722 Acquired absence of ovaries, bilateral: Principal | ICD-10-CM

## 2015-07-23 DIAGNOSIS — Z90710 Acquired absence of both cervix and uterus: Secondary | ICD-10-CM

## 2015-07-23 DIAGNOSIS — Z9889 Other specified postprocedural states: Secondary | ICD-10-CM

## 2015-07-23 MED ORDER — HYDROCODONE-ACETAMINOPHEN 5-325 MG PO TABS: 1.0000 | ORAL_TABLET | Freq: Four times a day (QID) | ORAL | Status: AC | PRN

## 2015-07-23 NOTE — Progress Notes (Signed)
Patient ID: Tara Barrett, female   DOB: 15-Jul-1976, 39 y.o.   MRN: [id number] In to check incision Some drainage x1  Incision clean dry intact with 1/2 cm seperation right in the middle no infection The rest of the incision looks good   keep scheduled Follow up appt in November  Gentian violet placed  Meds ordered this encounter  Medications  . HYDROcodone-acetaminophen (NORCO/VICODIN) 5-325 MG tablet    Sig: Take 1 tablet by mouth every 6 (six) hours as needed.    Dispense:  30 tablet    Refill:  0

## 2015-07-24 NOTE — Op Note (Signed)
Preoperative diagnosis:  1.  Chronic pelvic pain                                          2.  Midline and bilateral                                         3.  S/P ablation but no hematometra                                         4.  fibroids  Postoperative diagnosis:  Same as above    Procedure:  Abdominal hysterectomy with removal of both tubes and ovaries  Surgeon:  Florian Buff  Assistant:    Anesthesia:  General endotracheal  Preoperative clinical summary:  am seeing this patient as a referral from Dr Zada Girt due to worsening and unrelenting pelvic lower abdominal pain. She had an endometrial ablation 2+ years ago for menometrorrhagia and dysmenorrhea. She had known fibroids at the time. She overall had done pretty well until the last 6-8 weeks when she has had a significant increase in her pelvic and abdominal pain. Eventually she has had a CT scan which confirms the presence of fibroids and a small amount of endoemtrial fluid which is quite small and normal as a post ablation finding. On exam she is very tender in the midline and adnexa, my impression this represents an acute fibroid degeneration as the source for her pain, it is not consistent with a hematometra or adnexal pathology by scan. Sine she has signficant lateral pain as well, appropriate management surgically wopuld be to remove the ovaries as well.  Since this is my first time seeing pt i told her and her family I wanted her to think over her options, which really just include definitive surgical management with TAHBSO due to the intensity abruptness and location of her pain and since she has already had an endometrial ablation  Intraoperative findings: normal uterus tubes and ovaries small fibroids, ?adenomyosis  Description of operation:  Patient was taken to the operating room and placed in the supine position where she underwent general endotracheal anesthesia.  She was then prepped and draped in the usual sterile  fashion and a Foley catheter was placed for continuous bladder drainage.  A Pfannenstiel skin incision was made and carried down sharply to the rectus fascia which was scored in the midline and extended laterally.  The fascia was taken off the muscles superiorly and inferiorly without difficulty.  The muscles were divided.  The peritoneal cavity was entered.  An medium Alexis self-retaining retractor was placed.  The upper abdomen was packed away. Both uterine cornu were grasped with Coker clamps.  The left round ligament was suture ligated and coagulated with the electrocautery unit.  The left vesicouterine serosal flap was created.  An avascular window in in the peritoneum was created and the utero-ovarian ligament was cross clamped, cut and suture ligated.  The right round ligament was suture ligated and cut with the electrocautery unit.  The vesicouterine serosal flap on the right was created.  An avascular window in the peritoneum was created and the right utero-ovarian ligament was cross clamped, cut and double  suture ligated.  The left infundibulopelvic ligament was isolated cross clamped and double suture ligated.  The right infundibulopelvic ligament was then isolated clamped and double suture ligated.  Both sides were hemostatic  The uterine vessels were skeletonized bilaterally.  The uterine vessels were clamped bilaterally,  then cut and suture ligated.  Two more pedicles were taken down the cervix medial to the uterine vessels.  Each pedicle was clamped cut and suture ligated with good resulting hemostasis.  At the end of the cervix the vagina was crossclamped and vaginal angle sutures were placed.  The uterus was removed with the cervix in total.  The vagina was closed with interrupted figure-of-eight sutures.  The pelvis was irrigated vigorously and all pedicles were examined and found to be hemostatic.  The pelvic peritoneum was closed anterior to posterior.   All specimens were sent to pathology for  routine evaluation.  The Alexis self-retaining retractor was removed and the pelvis was irrigated vigorously.  All packs were removed and all counts were correct at this point x 3.  The muscles and peritoneum were reapproximated loosely.  The fascia was closed with 0 Vicryl running.  The subcutaneous tissue was reapproximated using 2-0 plain gut.  The skin was closed using 3-0 Vicryl on a Keith needle in a subcuticular fashion.  Dermabond was then applied for additional wound integrity and to serve as a postoperative bacterial barrier.  The patient was awakened from anesthesia taken to the recovery room in good stable condition. All sponge instrument and needle counts were correct x 3.  The patient received Ancef and Toradol prophylactically preoperatively.  Estimated blood loss for the procedure was 250  cc.  EURE,LUTHER H 07/04/2015 01:49 PM

## 2015-08-08 ENCOUNTER — Encounter: Payer: Self-pay | Admitting: Obstetrics & Gynecology

## 2015-08-08 ENCOUNTER — Ambulatory Visit (INDEPENDENT_AMBULATORY_CARE_PROVIDER_SITE_OTHER): Payer: BLUE CROSS/BLUE SHIELD | Admitting: Obstetrics & Gynecology

## 2015-08-08 VITALS — BP 120/80 | HR 72 | Ht 69.0 in | Wt 211.4 lb

## 2015-08-08 DIAGNOSIS — Z9889 Other specified postprocedural states: Secondary | ICD-10-CM

## 2015-08-08 DIAGNOSIS — Z90722 Acquired absence of ovaries, bilateral: Principal | ICD-10-CM

## 2015-08-08 DIAGNOSIS — Z9079 Acquired absence of other genital organ(s): Principal | ICD-10-CM

## 2015-08-08 DIAGNOSIS — Z90710 Acquired absence of both cervix and uterus: Secondary | ICD-10-CM

## 2015-08-08 NOTE — Progress Notes (Signed)
Patient ID: Tara Barrett, female   DOB: 20-Apr-1976, 39 y.o.   MRN: [id number]  HPI: Patient returns for routine postoperative follow-up having undergone TAHBSO on 07/04/2015.  The patient's immediate postoperative recovery has been unremarkable. Since hospital discharge the patient reports normal post op course.  Hot flushes are resolved as are migraines   Current Outpatient Prescriptions: ALPRAZolam (XANAX) 1 MG tablet, Take 1 mg by mouth 2 (two) times daily as needed for anxiety., Disp: , Rfl:  aspirin EC 325 MG tablet, Take 325 mg by mouth every 6 (six) hours as needed for mild pain or moderate pain., Disp: , Rfl:  benazepril-hydrochlorthiazide (LOTENSIN HCT) 20-12.5 MG tablet, Take 1 tablet by mouth daily., Disp: , Rfl: 0 Estradiol 1 MG/GM GEL, Place 1 packet onto the skin daily., Disp: 1 g, Rfl: 11 HYDROcodone-acetaminophen (NORCO/VICODIN) 5-325 MG tablet, Take 1 tablet by mouth every 6 (six) hours as needed., Disp: 30 tablet, Rfl: 0 ondansetron (ZOFRAN) 4 MG tablet, Take 1 tablet (4 mg total) by mouth every 6 (six) hours., Disp: 12 tablet, Rfl: 0 promethazine (PHENERGAN) 25 MG tablet, Take 25 mg by mouth 2 (two) times daily as needed for nausea or vomiting., Disp: , Rfl:  rizatriptan (MAXALT) 10 MG tablet, Take 10 mg by mouth 2 (two) times daily as needed for migraine. May repeat in 2 hours if needed, Disp: , Rfl:  Multiple Vitamin (MULTIVITAMIN WITH MINERALS) TABS tablet, Take 1 tablet by mouth daily., Disp: , Rfl:  Omega-3 Fatty Acids (FISH OIL PO), Take 1,500 mg by mouth daily., Disp: , Rfl:  [DISCONTINUED] phentermine (ADIPEX-P) 37.5 MG tablet, Take 37.5 mg by mouth daily before breakfast., Disp: , Rfl:   No current facility-administered medications for this visit.    Blood pressure 120/80, pulse 72, height 5\' 9"  (1.753 m), weight 211 lb 6.4 oz (95.89 kg), last menstrual period 06/28/2015.  Physical Exam: Incision clean dry intact, small midline seperation gentian violet  placed Abdomen soft normal post op Cuff intact no lesions no discharge Bimanual no midline masses   Diagnostic Tests: none  Pathology: benign  Impression: S/p TAHBSO  Plan: No sex for 2-3 weeks Return to work in about 2-3 weeks  Follow up: 1  years  Florian Buff, MD

## 2015-09-11 ENCOUNTER — Telehealth: Payer: Self-pay | Admitting: Obstetrics & Gynecology

## 2015-09-11 MED ORDER — ESTRADIOL 2 MG PO TABS: 2.0000 mg | ORAL_TABLET | Freq: Every day | ORAL | Status: DC

## 2015-09-11 NOTE — Telephone Encounter (Signed)
Pt states taking Divigel,  Hot flashes had improved with initially starting Divigel but now having a lot of hot flashes. A friend suggested the patch as an alternative? Family Hx of breast cancer.

## 2015-09-12 ENCOUNTER — Telehealth: Payer: Self-pay | Admitting: Obstetrics & Gynecology

## 2015-09-12 MED ORDER — RIZATRIPTAN BENZOATE 10 MG PO TABS: 10.0000 mg | ORAL_TABLET | Freq: Two times a day (BID) | ORAL | Status: AC | PRN

## 2015-09-12 MED ORDER — ESTRADIOL 0.1 MG/24HR TD PTTW: 1.0000 | MEDICATED_PATCH | TRANSDERMAL | Status: DC

## 2015-09-12 NOTE — Telephone Encounter (Signed)
Spoke with pt. Pt has been on the Estradiol Gel but it's not helping now. Pt wants to try the [place] patch 0.1. Pt also states she is still having pain with intercourse. Is this normal? Had surgery 06/03/15. Thanks! [place]

## 2015-09-12 NOTE — Telephone Encounter (Signed)
Left message x 1. JSY 

## 2015-09-12 NOTE — Telephone Encounter (Signed)
Pt called stating that she needs a refill of her hormone patches and medication for her migraine headaches. Pt also states that it is still painful when she has intercourse. Please contact pt

## 2015-09-13 ENCOUNTER — Telehealth: Payer: Self-pay | Admitting: Obstetrics & Gynecology

## 2015-09-13 NOTE — Telephone Encounter (Signed)
Duplicate message. 

## 2015-09-13 NOTE — Telephone Encounter (Signed)
Pt informed patch and Maxalt e-scribed. Pt states she is going to try the Estradiol and Divigel for the hot flashes and if no improvement will try the patch.

## 2016-02-05 IMAGING — CT CT ABD-PELV W/ CM
2 of 4 series · 16 of 46 positions shown, 18 images · IV contrast (Omnipaque 300)
Comparison: None.

CLINICAL DATA: Chronic intermittent abdominal pain. Now with
constant lower abdominal pain onset 2 weeks ago. Nausea and vaginal
discharge onset 8 years ago. Productive cough. Shortness of breath.
Back pain, headache, dizziness.

EXAM:
CT ABDOMEN AND PELVIS WITH CONTRAST
TECHNIQUE: Multidetector CT imaging of the abdomen and pelvis was performed
using the standard protocol following bolus administration of
intravenous contrast.
CONTRAST:  50mL OMNIPAQUE IOHEXOL 300 MG/ML SOLN, 100mL OMNIPAQUE
IOHEXOL 300 MG/ML SOLN

[Series 2: abd_pel_with 5.0 b40f · axial · 0.81mm/px · z∈[-388,+12]mm · 13 of 90 slices shown, 15 images]
[im 5/90  soft-tissue]
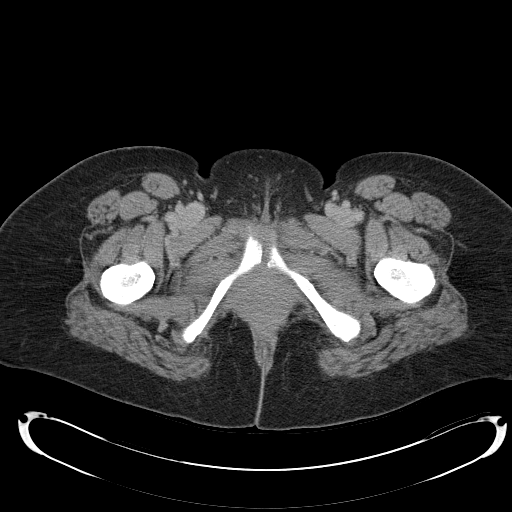
[im 5/90  bone]
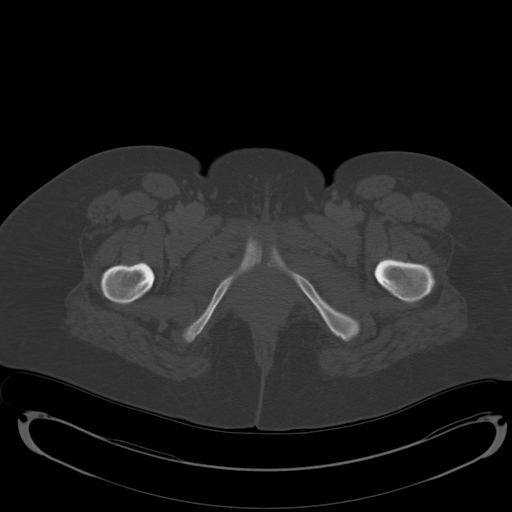
[im 13/90  soft-tissue]
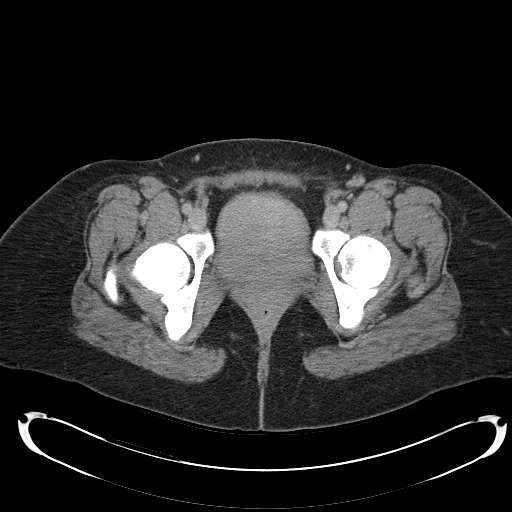
[im 21/90  soft-tissue]
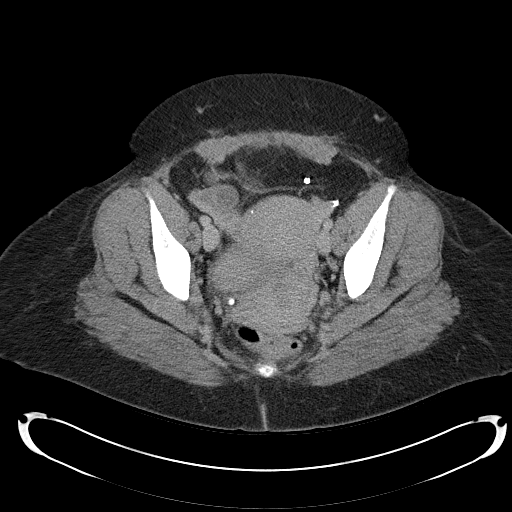
[im 25/90  soft-tissue]
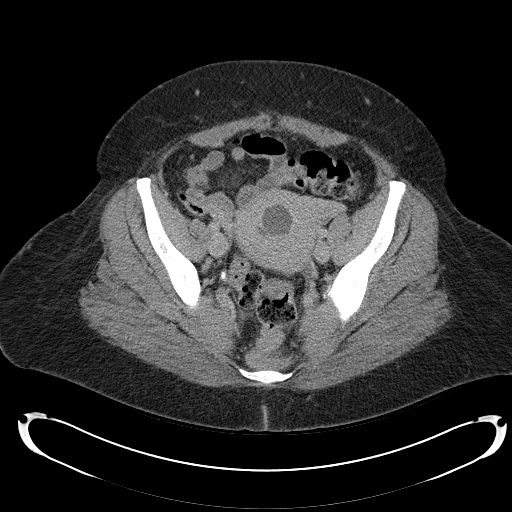
[im 33/90  soft-tissue]
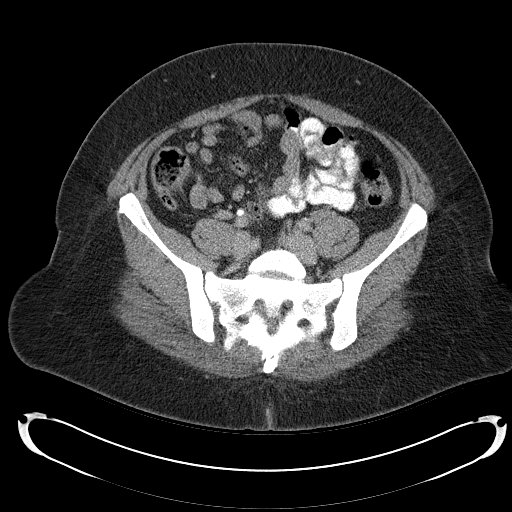
[im 37/90  soft-tissue]
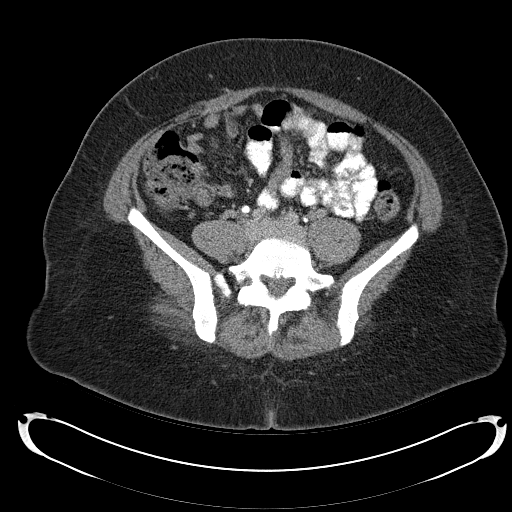
[im 45/90  soft-tissue]
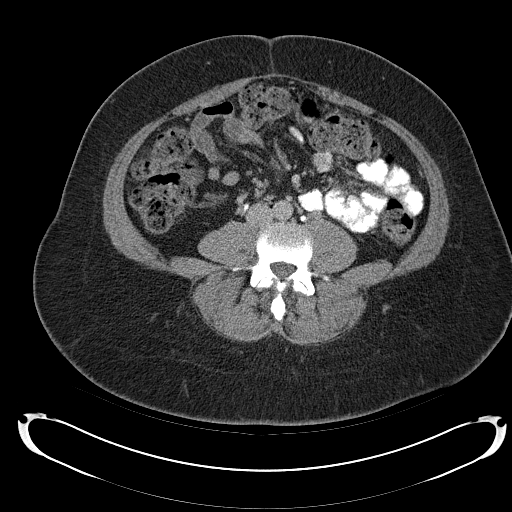
[im 53/90  soft-tissue]
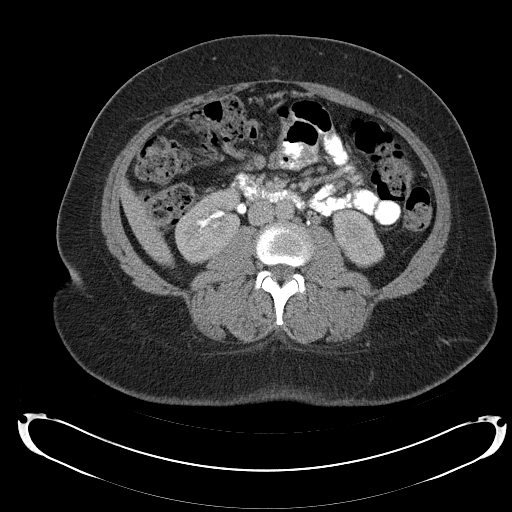
[im 57/90  soft-tissue]
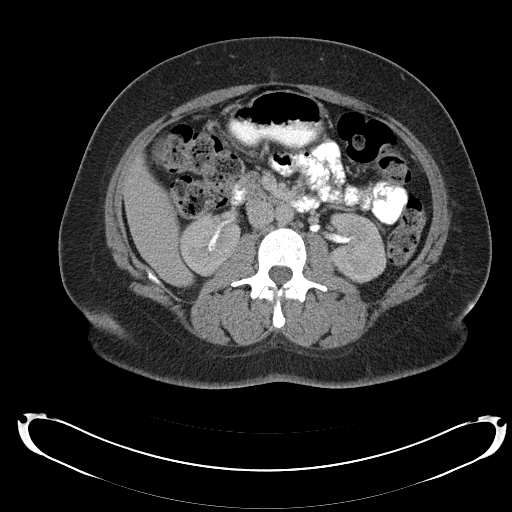
[im 57/90  bone]
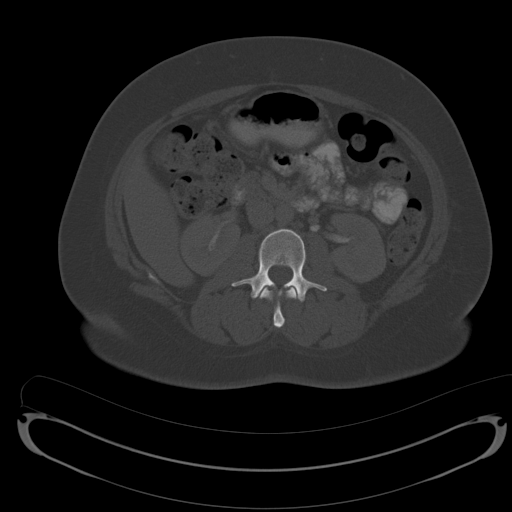
[im 65/90  soft-tissue]
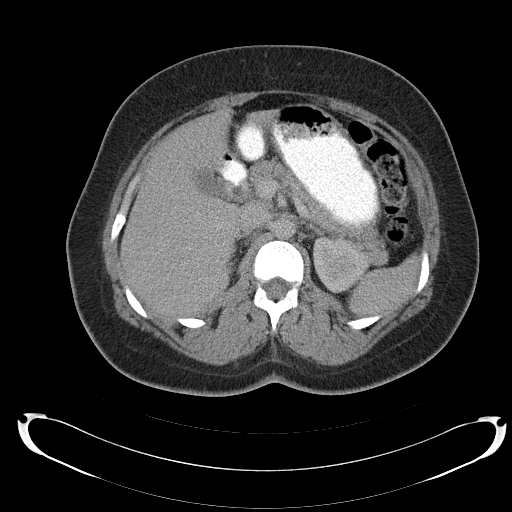
[im 69/90  soft-tissue]
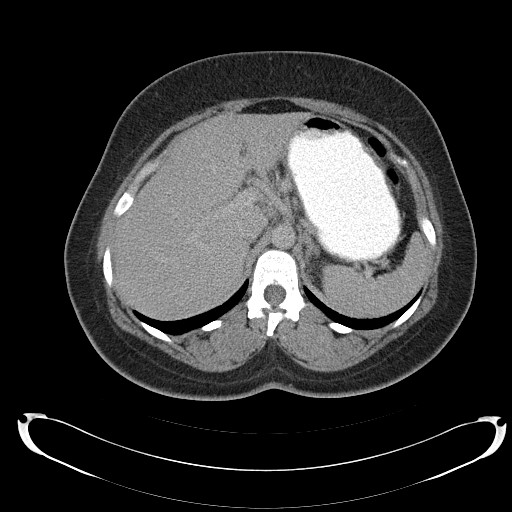
[im 77/90  soft-tissue]
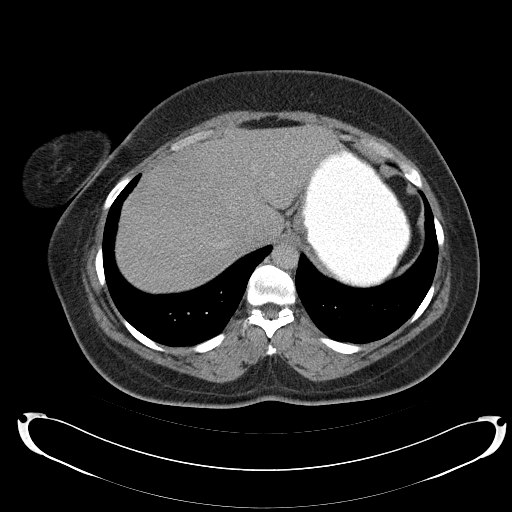
[im 85/90  soft-tissue]
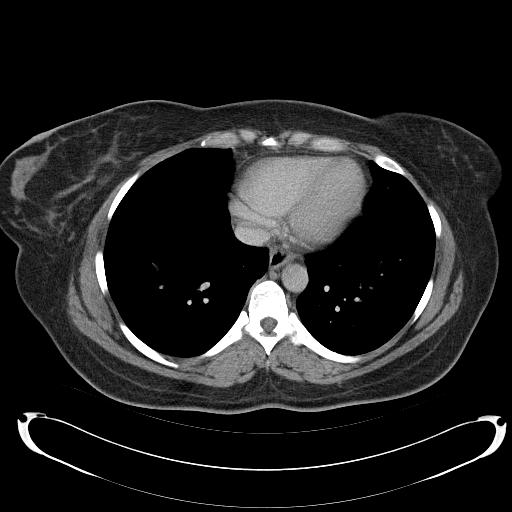

[Series 3: abd_pel_with 3.0 spo cor · coronal · 0.71mm/px · 3 of 98 slices shown]
[im 33/98  soft-tissue]
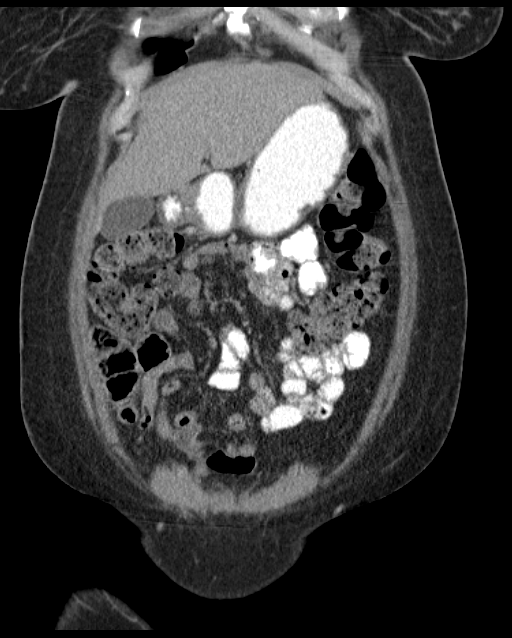
[im 44/98  soft-tissue]
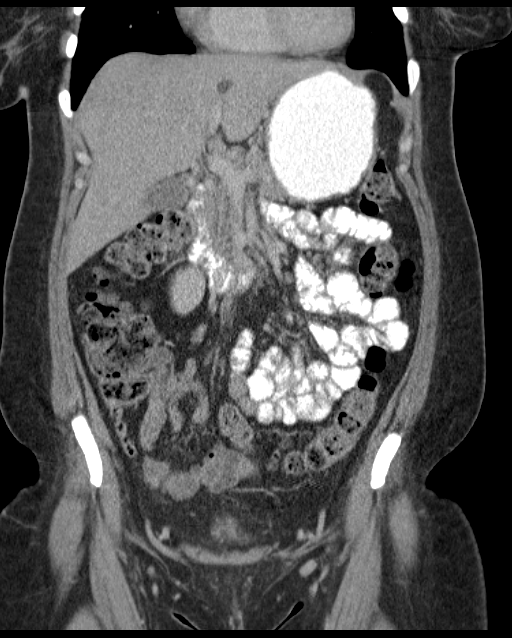
[im 54/98  soft-tissue]
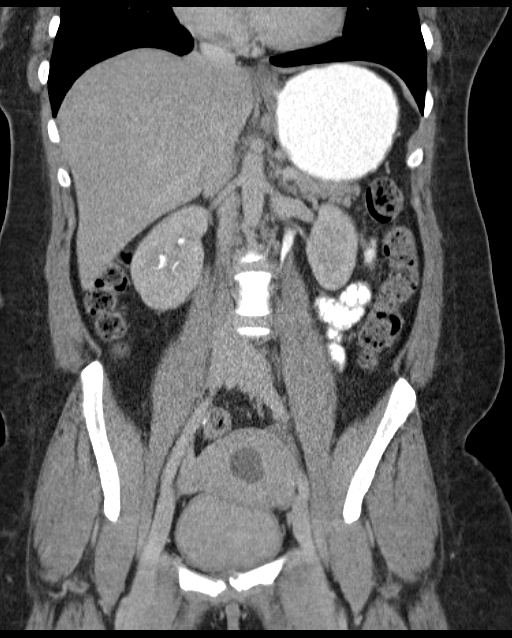

[16 of 46 positions shown; findings below may reference images not displayed]

FINDINGS: Mild dependent changes in the lung bases.

Examination seems to been obtained in the delayed phase after
contrast administration. The liver, spleen, gallbladder, pancreas,
adrenal glands, kidneys, abdominal aorta, inferior vena cava, and
retroperitoneal lymph nodes are unremarkable. Stomach, small bowel,
and colon are not abnormally distended. Stool fills the colon.
Mesenteric lymph nodes are not pathologically enlarged. No free air
or free fluid in the abdomen.

Pelvis: Appendix is normal. Uterus is enlarged and there is a cystic
collection in the endometrium measuring 2.4 cm diameter. Correlation
with pregnancy test is recommended to exclude early pregnancy.
Consider ultrasound for evaluation of the endometrium. No abnormal
adnexal masses. Bladder wall is not thickened. Surgical clips in the
pelvis possibly tubal ligation. No free or loculated pelvic fluid
collections. No pelvic mass or lymphadenopathy. No destructive bone
lesions.
IMPRESSION: Uterine enlargement with cystic structure appearing in the
endometrium. Correlation with pregnancy test is suggested. If
pregnancy test is negative, suggest ultrasound for further
evaluation of possible endometrial cyst or mass. Necrotic fibroid
could also potentially have this appearance. Otherwise, no acute
process demonstrated. No evidence of bowel obstruction or
inflammation. Appendix is normal.

## 2016-09-22 ENCOUNTER — Other Ambulatory Visit: Payer: Self-pay | Admitting: Obstetrics & Gynecology

## 2016-10-12 ENCOUNTER — Emergency Department (HOSPITAL_COMMUNITY)
Admission: EM | Admit: 2016-10-12 | Discharge: 2016-10-12 | Disposition: A | Discharge status: Home / Self Care | Payer: BLUE CROSS/BLUE SHIELD | Attending: Emergency Medicine | Admitting: Emergency Medicine

## 2016-10-12 ENCOUNTER — Encounter (HOSPITAL_COMMUNITY): Payer: Self-pay | Admitting: Emergency Medicine

## 2016-10-12 ENCOUNTER — Emergency Department (HOSPITAL_COMMUNITY): Payer: BLUE CROSS/BLUE SHIELD

## 2016-10-12 DIAGNOSIS — R05 Cough: Secondary | ICD-10-CM | POA: Diagnosis present

## 2016-10-12 DIAGNOSIS — J209 Acute bronchitis, unspecified: Secondary | ICD-10-CM | POA: Diagnosis not present

## 2016-10-12 DIAGNOSIS — Z79899 Other long term (current) drug therapy: Secondary | ICD-10-CM | POA: Insufficient documentation

## 2016-10-12 DIAGNOSIS — Z7982 Long term (current) use of aspirin: Secondary | ICD-10-CM | POA: Insufficient documentation

## 2016-10-12 HISTORY — DX: Dorsalgia, unspecified: M54.9

## 2016-10-12 HISTORY — DX: Other chronic pain: G89.29

## 2016-10-12 MED ORDER — PREDNISONE 50 MG PO TABS
60.0000 mg | ORAL_TABLET | Freq: Once | ORAL | Status: AC
  Administered 2016-10-12: 60 mg via ORAL
  Filled 2016-10-12: qty 1

## 2016-10-12 MED ORDER — GUAIFENESIN-CODEINE 100-10 MG/5ML PO SOLN: 10.0000 mL | Freq: Four times a day (QID) | ORAL | 0 refills | Status: DC | PRN

## 2016-10-12 MED ORDER — PREDNISONE 10 MG PO TABS: ORAL_TABLET | ORAL | 0 refills | Status: DC

## 2016-10-12 MED ORDER — ALBUTEROL SULFATE HFA 108 (90 BASE) MCG/ACT IN AERS: 2.0000 | INHALATION_SPRAY | RESPIRATORY_TRACT | Status: DC | PRN

## 2016-10-12 MED ORDER — BENZONATATE 100 MG PO CAPS: 200.0000 mg | ORAL_CAPSULE | Freq: Three times a day (TID) | ORAL | 0 refills | Status: AC | PRN

## 2016-10-12 MED ORDER — HYDROCOD POLST-CPM POLST ER 10-8 MG/5ML PO SUER: 5.0000 mL | Freq: Two times a day (BID) | ORAL | 0 refills | Status: AC | PRN

## 2016-10-12 MED ORDER — HYDROCOD POLST-CPM POLST ER 10-8 MG/5ML PO SUER
5.0000 mL | Freq: Once | ORAL | Status: AC
  Administered 2016-10-12: 5 mL via ORAL
  Filled 2016-10-12: qty 5

## 2016-10-12 MED ORDER — IPRATROPIUM-ALBUTEROL 0.5-2.5 (3) MG/3ML IN SOLN
3.0000 mL | Freq: Once | RESPIRATORY_TRACT | Status: AC
  Administered 2016-10-12: 3 mL via RESPIRATORY_TRACT
  Filled 2016-10-12: qty 3

## 2016-10-12 MED ORDER — ALBUTEROL SULFATE (2.5 MG/3ML) 0.083% IN NEBU
2.5000 mg | INHALATION_SOLUTION | Freq: Once | RESPIRATORY_TRACT | Status: AC
  Administered 2016-10-12: 2.5 mg via RESPIRATORY_TRACT
  Filled 2016-10-12: qty 3

## 2016-10-12 MED ORDER — ALBUTEROL SULFATE HFA 108 (90 BASE) MCG/ACT IN AERS: 1.0000 | INHALATION_SPRAY | Freq: Four times a day (QID) | RESPIRATORY_TRACT | 0 refills | Status: AC | PRN

## 2016-10-12 NOTE — ED Provider Notes (Signed)
[facility] DEPT Provider Note   CSN: [id number] Arrival date & time: 10/12/16  1806   By signing my name below, I, Collene Leyden, attest that this documentation has been prepared under the direction and in the presence of Evalee Jefferson, PA-C. Electronically Signed: Collene Leyden, Scribe. 10/12/16. 7:04 PM.  History   Chief Complaint Chief Complaint  Patient presents with  . Cough  . Fever    HPI Comments: Tara Barrett is a 41 y.o. female with a hx of chronic pain, who presents to the Emergency Department complaining of an intermittent, productive cough with brown/yellow sputum since January 1st. Patient states she is coughing to the point where she is vomiting. Patient has associated wheezing, shortness of breath, migraine,  and nasal congestion. Patient reports being seen by her primary care physician one week ago in which she was prescribed a z-pak, with no improvement.  Patient reports taking alka selzer and Nyquil with no improvement. Patient denies any fluid change, fever, abdominal pain, or sore throat.   The history is provided by the patient. No language interpreter was used.  Fever   Associated symptoms include congestion and cough. Pertinent negatives include no sore throat.    Past Medical History:  Diagnosis Date  . Anxiety   . Chronic abdominal pain   . Chronic back pain   . Complication of anesthesia [id number]   Unknown complication [facility] hospital during C section    Patient Active Problem List   Diagnosis Date Noted  . S/P total hysterectomy and bilateral salpingo-oophorectomy 07/03/2015    Past Surgical History:  Procedure Laterality Date  . ABDOMINAL HYSTERECTOMY N/A 07/03/2015   Procedure: HYSTERECTOMY ABDOMINAL;  Surgeon: Florian Buff, MD;  Location: [facility] ORS;  Service: Gynecology;  Laterality: N/A;  . CERVICAL CERCLAGE    . CESAREAN SECTION    . SALPINGOOPHORECTOMY Bilateral 07/03/2015   Procedure: BILATERAL SALPINGO OOPHORECTOMY;  Surgeon: Florian Buff, MD;  Location: [facility] OR[facility];  Service: Gynecology;  Laterality: Bilateral;  . TONSILLECTOMY    . uterine ablation      OB History    No data available       Home Medications    Prior to Admission medications   Medication Sig Start Date End Date Taking? Authorizing Provider  albuterol (PROVENTIL HFA;VENTOLIN HFA) 108 (90 Base) MCG/ACT inhaler Inhale 1-2 puffs into the lungs every 6 (six) hours as needed for wheezing or shortness of breath. 10/12/16   Evalee Jefferson, PA-C  ALPRAZolam Duanne Moron) 1 MG tablet Take 1 mg by mouth 2 (two) times daily as needed for anxiety.    Historical Provider, MD  aspirin EC 325 MG tablet Take 325 mg by mouth every 6 (six) hours as needed for mild pain or moderate pain.    Historical Provider, MD  benazepril-hydrochlorthiazide (LOTENSIN HCT) 20-12.5 MG tablet Take 1 tablet by mouth daily. 05/17/15   Historical Provider, MD  estradiol (ESTRACE) 2 MG tablet Take 1 tablet (2 mg total) by mouth daily. 09/11/15   Florian Buff, MD  estradiol (VIVELLE-DOT) 0.1 MG/24HR patch APPLY 1 PATCH TO SKIN TWICE A WEEK 09/23/16   Florian Buff, MD  Estradiol 1 MG/GM GEL Place 1 packet onto the skin daily. 07/11/15   Florian Buff, MD  guaiFENesin-codeine 100-10 MG/5ML syrup Take 10 mLs by mouth every 6 (six) hours as needed for cough. 10/12/16   Evalee Jefferson, PA-C  HYDROcodone-acetaminophen (NORCO/VICODIN) 5-325 MG tablet Take 1 tablet by mouth every 6 (six) hours as needed.  07/23/15   Florian Buff, MD  Multiple Vitamin (MULTIVITAMIN WITH MINERALS) TABS tablet Take 1 tablet by mouth daily.    Historical Provider, MD  Omega-3 Fatty Acids (FISH OIL PO) Take 1,500 mg by mouth daily.    Historical Provider, MD  ondansetron (ZOFRAN) 4 MG tablet Take 1 tablet (4 mg total) by mouth every 6 (six) hours. 06/21/15   Virgel Manifold, MD  predniSONE (DELTASONE) 10 MG tablet Take 6 tablets day one, 5 tablets day two, 4 tablets day three, 3 tablets day four, 2 tablets day five, then 1 tablet day six  10/12/16   Evalee Jefferson, PA-C  promethazine (PHENERGAN) 25 MG tablet Take 25 mg by mouth 2 (two) times daily as needed for nausea or vomiting.    Historical Provider, MD  rizatriptan (MAXALT) 10 MG tablet Take 1 tablet (10 mg total) by mouth 2 (two) times daily as needed for migraine. May repeat in 2 hours if needed 09/12/15   Florian Buff, MD    Family History Family History  Problem Relation Age of Onset  . Hypertension Father   . Diabetes Father   . Other Sister     sciatica   . ADD / ADHD Son     Social History Social History  Substance Use Topics  . Smoking status: Never Smoker  . Smokeless tobacco: Never Used  . Alcohol use Yes     Comment: occasionally     Allergies   Patient has no known allergies.   Review of Systems Review of Systems  Constitutional: Negative for fever.  HENT: Positive for congestion. Negative for sore throat.   Respiratory: Positive for cough, shortness of breath and wheezing.   Gastrointestinal: Negative for abdominal pain.     Physical Exam Updated Vital Signs BP 142/86 (BP Location: Right Arm)   Pulse 98   Temp 98.4 F (36.9 C) (Oral)   Resp 22   Ht 5\' 9"  (1.753 m)   Wt 113.4 kg   LMP 06/28/2015   SpO2 97%   BMI 36.92 kg/m   Physical Exam  Constitutional: She is oriented to person, place, and time. She appears well-developed.  HENT:  Head: Normocephalic and atraumatic.  Mouth/Throat: Oropharynx is clear and moist.  Oropharynx is clear and moist.   Eyes: Conjunctivae and EOM are normal. Pupils are equal, round, and reactive to light.  Neck: Normal range of motion. Neck supple.  Cardiovascular: Normal rate.   Pulmonary/Chest: Effort normal. She has wheezes.  Frequent dry cough.  Expiratory wheezes in the right upper lung field with prolonged expirations.  Abdominal: Soft. Bowel sounds are normal.  Musculoskeletal: Normal range of motion.  Neurological: She is alert and oriented to person, place, and time.  Skin: Skin is  warm and dry.     ED Treatments / Results  DIAGNOSTIC STUDIES: Oxygen Saturation is 98% on RA, normal by my interpretation.    COORDINATION OF CARE: 7:02 PM Discussed treatment plan with pt at bedside and pt agreed to plan.  Labs (all labs ordered are listed, but only abnormal results are displayed) Labs Reviewed - No data to display  EKG  EKG Interpretation None       Radiology Dg Chest 2 View  Result Date: 10/12/2016 CLINICAL DATA:  Cough and fever since January.  Nonsmoker. EXAM: CHEST  2 VIEW COMPARISON:  None. FINDINGS: The heart size and mediastinal contours are within normal limits. Both lungs are clear. The visualized skeletal structures are unremarkable. IMPRESSION: No active  cardiopulmonary disease. Electronically Signed   By: Ashley Royalty M.D.   On: 10/12/2016 18:54    Procedures Procedures (including critical care time)  Medications Ordered in ED Medications  chlorpheniramine-HYDROcodone (TUSSIONEX) 10-8 MG/5ML suspension 5 mL (5 mLs Oral Given 10/12/16 1942)  ipratropium-albuterol (DUONEB) 0.5-2.5 (3) MG/3ML nebulizer solution 3 mL (3 mLs Nebulization Given 10/12/16 1918)  albuterol (PROVENTIL) (2.5 MG/3ML) 0.083% nebulizer solution 2.5 mg (2.5 mg Nebulization Given 10/12/16 1918)  predniSONE (DELTASONE) tablet 60 mg (60 mg Oral Given 10/12/16 1942)     Initial Impression / Assessment and Plan / ED Course  I have reviewed the triage vital signs and the nursing notes.  Pertinent labs & imaging results that were available during my care of the patient were reviewed by me and considered in my medical decision making (see chart for details).  Clinical Course    Breathing treatment, tussionex, and prednisone.  Sx of cough and wheezing improved after receiving albuterol/atrovent neb.  Prescribed albuterol mdi, prednisone taper, robitussin AC.  Prn f/u anticipated.  Final Clinical Impressions(s) / ED Diagnoses   Final diagnoses:  Acute bronchitis, unspecified  organism    New Prescriptions New Prescriptions   ALBUTEROL (PROVENTIL HFA;VENTOLIN HFA) 108 (90 BASE) MCG/ACT INHALER    Inhale 1-2 puffs into the lungs every 6 (six) hours as needed for wheezing or shortness of breath.   GUAIFENESIN-CODEINE 100-10 MG/5ML SYRUP    Take 10 mLs by mouth every 6 (six) hours as needed for cough.   PREDNISONE (DELTASONE) 10 MG TABLET    Take 6 tablets day one, 5 tablets day two, 4 tablets day three, 3 tablets day four, 2 tablets day five, then 1 tablet day six   I personally performed the services described in this documentation, which was scribed in my presence. The recorded information has been reviewed and is accurate.    Evalee Jefferson, PA-C 10/12/16 2018    Virgel Manifold, MD 10/19/16 [phone]

## 2016-10-12 NOTE — ED Notes (Signed)
Patient has severe cough and dry heaving in triage.

## 2016-10-12 NOTE — ED Triage Notes (Addendum)
Patient complaining of cough and fever since January 1. States she is coughing up brown sputum. States she was seen by PCP 1 week ago and is no better, states "I'm coughing til I vomit." Complains of chest pain with deep breathing and cough.

## 2016-12-21 ENCOUNTER — Telehealth: Payer: Self-pay | Admitting: Obstetrics & Gynecology

## 2016-12-21 NOTE — Telephone Encounter (Signed)
Pt requesting an antibiotic refill for Flagyl because she thinks she has an infection. Spoke with patient and recommended that she make an appointment to be seen by a provider, since it had been over a year, to confirm the need for antibiotics. Pt verbalized understanding.

## 2016-12-22 ENCOUNTER — Encounter: Payer: Self-pay | Admitting: Obstetrics & Gynecology

## 2016-12-22 ENCOUNTER — Ambulatory Visit (INDEPENDENT_AMBULATORY_CARE_PROVIDER_SITE_OTHER): Payer: BLUE CROSS/BLUE SHIELD | Admitting: Obstetrics & Gynecology

## 2016-12-22 VITALS — BP 128/80 | HR 96 | Wt 232.0 lb

## 2016-12-22 DIAGNOSIS — N898 Other specified noninflammatory disorders of vagina: Secondary | ICD-10-CM | POA: Diagnosis not present

## 2016-12-22 DIAGNOSIS — B9689 Other specified bacterial agents as the cause of diseases classified elsewhere: Secondary | ICD-10-CM

## 2016-12-22 DIAGNOSIS — N760 Acute vaginitis: Secondary | ICD-10-CM | POA: Diagnosis not present

## 2016-12-22 MED ORDER — ESTRADIOL 0.1 MG/GM VA CREA: 1.0000 | TOPICAL_CREAM | Freq: Every day | VAGINAL | 12 refills | Status: AC

## 2016-12-22 MED ORDER — METRONIDAZOLE 0.75 % VA GEL: 1.0000 | Freq: Two times a day (BID) | VAGINAL | 0 refills | Status: AC

## 2016-12-22 NOTE — Progress Notes (Signed)
Chief Complaint  Patient presents with  . Vaginal Discharge    fishy odor; burning    Blood pressure 128/80, pulse 96, weight 232 lb (105.2 kg), last menstrual period 06/28/2015.  41 y.o. No obstetric history on file. Patient's last menstrual period was 06/28/2015. The current method of family planning is status post hysterectomy.  Outpatient Encounter Prescriptions as of 12/22/2016  Medication Sig Note  . albuterol (PROVENTIL HFA;VENTOLIN HFA) 108 (90 Base) MCG/ACT inhaler Inhale 1-2 puffs into the lungs every 6 (six) hours as needed for wheezing or shortness of breath.   . ALPRAZolam (XANAX) 1 MG tablet Take 1 mg by mouth 2 (two) times daily as needed for anxiety.   Marland Kitchen aspirin EC 325 MG tablet Take 325 mg by mouth every 6 (six) hours as needed for mild pain or moderate pain.   Marland Kitchen atorvastatin (LIPITOR) 10 MG tablet Take 10 mg by mouth daily.   . benazepril-hydrochlorthiazide (LOTENSIN HCT) 20-12.5 MG tablet Take 1 tablet by mouth daily. 06/28/2015: Received from: External Pharmacy  . benzonatate (TESSALON) 100 MG capsule Take 2 capsules (200 mg total) by mouth 3 (three) times daily as needed.   . chlorpheniramine-HYDROcodone (TUSSIONEX PENNKINETIC ER) 10-8 MG/5ML SUER Take 5 mLs by mouth every 12 (twelve) hours as needed for cough.   . estradiol (VIVELLE-DOT) 0.1 MG/24HR patch APPLY 1 PATCH TO SKIN TWICE A WEEK   . HYDROcodone-acetaminophen (NORCO/VICODIN) 5-325 MG tablet Take 1 tablet by mouth every 6 (six) hours as needed.   . metoprolol tartrate (LOPRESSOR) 12.5 mg TABS tablet Take 12.5 mg by mouth 2 (two) times daily.   . Multiple Vitamin (MULTIVITAMIN WITH MINERALS) TABS tablet Take 1 tablet by mouth daily.   . Omega-3 Fatty Acids (FISH OIL PO) Take 1,500 mg by mouth daily.   . ondansetron (ZOFRAN) 4 MG tablet Take 1 tablet (4 mg total) by mouth every 6 (six) hours.   . promethazine (PHENERGAN) 25 MG tablet Take 25 mg by mouth 2 (two) times daily as needed for nausea or  vomiting.   . rizatriptan (MAXALT) 10 MG tablet Take 1 tablet (10 mg total) by mouth 2 (two) times daily as needed for migraine. May repeat in 2 hours if needed   . estradiol (ESTRACE VAGINAL) 0.1 MG/GM vaginal cream Place 1 Applicatorful vaginally at bedtime.   . metroNIDAZOLE (METROGEL VAGINAL) 0.75 % vaginal gel Place 1 Applicatorful vaginally 2 (two) times daily.   . [DISCONTINUED] estradiol (ESTRACE) 2 MG tablet Take 1 tablet (2 mg total) by mouth daily. (Patient not taking: Reported on 12/22/2016)   . [DISCONTINUED] Estradiol 1 MG/GM GEL Place 1 packet onto the skin daily.   . [DISCONTINUED] predniSONE (DELTASONE) 10 MG tablet Take 6 tablets day one, 5 tablets day two, 4 tablets day three, 3 tablets day four, 2 tablets day five, then 1 tablet day six    No facility-administered encounter medications on file as of 12/22/2016.     Subjective Pt presnets stating that really since 08/2015 she has had vaginal burning tenderness feeling like she had raw scabs during and after sexual intercourse with her husband, a malodorous vaginal discharge, poor sexual response and libido, difficulty having orgasm,feeling like she is on fire at times,  Pt does take her vivelle dot 0.1 mg and is not having any hot flushes or night sweats She does not use lubrication She has douched x 2 once with plain water and once with vinegar and water  I have not seen  her since her initial post op visit 07/2015  Objective General WDWN female NAD Vulva:  normal appearing vulva with no masses, tenderness or lesions, some greyins discharge is apparent on the forchette, a little atrophic in appearance Vagina:  Atrophic in appearance, copious grey discharge, cuff intact, no lesions or masses, loss of ruggae Cervix:  absent Uterus:  uterus absent Adnexa: ovaries:not present,  normal adnexa in size, nontender and no masses, no masses   Pertinent ROS No burning with urination, frequency or urgency No nausea, vomiting or  diarrhea Nor fever chills or other constitutional symptoms   Labs or studies Wet Prep:   A sample of vaginal discharge was obtained from the posterior fornix using a cotton swab. 2 drops of saline were placed on a slide and the cotton swab was immersed in the saline. Microscopic evaluation was performed and results were as follows:  Negative  for yeast  Positive for clue cells , consistent with Bacterial vaginosis Negative for trichomonas  Normal WBC population   Whiff test: Positive     Impression Diagnoses this Encounter::   ICD-9-CM ICD-10-CM   1. BV (bacterial vaginosis) 616.10 N76.0    041.9 B96.89   2. Vaginal dryness 625.8 N89.8     Established relevant diagnosis(es):   Plan/Recommendations: Meds ordered this encounter  Medications  . atorvastatin (LIPITOR) 10 MG tablet    Sig: Take 10 mg by mouth daily.  . metoprolol tartrate (LOPRESSOR) 12.5 mg TABS tablet    Sig: Take 12.5 mg by mouth 2 (two) times daily.  . metroNIDAZOLE (METROGEL VAGINAL) 0.75 % vaginal gel    Sig: Place 1 Applicatorful vaginally 2 (two) times daily.    Dispense:  70 g    Refill:  0  . estradiol (ESTRACE VAGINAL) 0.1 MG/GM vaginal cream    Sig: Place 1 Applicatorful vaginally at bedtime.    Dispense:  42.5 g    Refill:  12    Labs or Scans Ordered: No orders of the defined types were placed in this encounter.   Management:: It is hard to absolutely identify which led to which but she does have a heavy BV infection which we will tackle first She will take it every other night for 10 nights(5 total applications) Then she will begin to use estrace cream nightly I will see her back in 3 weeks Hopefully when we re equilibrate her vaginal flora we can improve her vaginal health overall She will need to use local estrogen however probably chronically  Follow up Return in about 3 weeks (around 01/12/2017) for Follow up, with Dr Elonda Husky.           All questions were  answered.  Past Medical History:  Diagnosis Date  . Anxiety   . Chronic abdominal pain   . Chronic back pain   . Complication of anesthesia 01/03/5461   Unknown complication [facility] hospital during C section  . Shortness of breath     Past Surgical History:  Procedure Laterality Date  . ABDOMINAL HYSTERECTOMY N/A 07/03/2015   Procedure: HYSTERECTOMY ABDOMINAL;  Surgeon: Florian Buff, MD;  Location: [facility];  Service: Gynecology;  Laterality: N/A;  . CERVICAL CERCLAGE    . CESAREAN SECTION    . SALPINGOOPHORECTOMY Bilateral 07/03/2015   Procedure: BILATERAL SALPINGO OOPHORECTOMY;  Surgeon: Florian Buff, MD;  Location: [facility];  Service: Gynecology;  Laterality: Bilateral;  . TONSILLECTOMY    . uterine ablation      OB History  No data available      No Known Allergies  Social History   Social History  . Marital status: Married    Spouse name: N/A  . Number of children: N/A  . Years of education: N/A   Social History Main Topics  . Smoking status: Never Smoker  . Smokeless tobacco: Never Used  . Alcohol use Yes     Comment: occasionally  . Drug use: No  . Sexual activity: Yes    Birth control/ protection: Surgical   Other Topics Concern  . None   Social History Narrative  . None    Family History  Problem Relation Age of Onset  . Hypertension Father   . Diabetes Father   . Other Sister     sciatica   . ADD / ADHD Son

## 2017-01-07 ENCOUNTER — Ambulatory Visit (INDEPENDENT_AMBULATORY_CARE_PROVIDER_SITE_OTHER): Payer: BLUE CROSS/BLUE SHIELD | Admitting: Obstetrics & Gynecology

## 2017-01-07 ENCOUNTER — Encounter: Payer: Self-pay | Admitting: Obstetrics & Gynecology

## 2017-01-07 VITALS — BP 124/82 | HR 98 | Wt 227.0 lb

## 2017-01-07 DIAGNOSIS — B373 Candidiasis of vulva and vagina: Secondary | ICD-10-CM | POA: Diagnosis not present

## 2017-01-07 DIAGNOSIS — N952 Postmenopausal atrophic vaginitis: Secondary | ICD-10-CM

## 2017-01-07 DIAGNOSIS — N898 Other specified noninflammatory disorders of vagina: Secondary | ICD-10-CM

## 2017-01-07 DIAGNOSIS — B3731 Acute candidiasis of vulva and vagina: Secondary | ICD-10-CM

## 2017-01-07 MED ORDER — FLUCONAZOLE 100 MG PO TABS: 100.0000 mg | ORAL_TABLET | Freq: Every day | ORAL | 0 refills | Status: DC

## 2017-01-07 NOTE — Progress Notes (Signed)
Chief Complaint  Patient presents with  . Follow-up    Blood pressure 124/82, pulse 98, weight 227 lb (103 kg), last menstrual period 06/28/2015.  41 y.o. No obstetric history on file. Patient's last menstrual period was 06/28/2015. The current method of family planning is status post hysterectomy.  Outpatient Encounter Prescriptions as of 01/07/2017  Medication Sig Note  . albuterol (PROVENTIL HFA;VENTOLIN HFA) 108 (90 Base) MCG/ACT inhaler Inhale 1-2 puffs into the lungs every 6 (six) hours as needed for wheezing or shortness of breath.   . ALPRAZolam (XANAX) 1 MG tablet Take 1 mg by mouth 2 (two) times daily as needed for anxiety.   Marland Kitchen aspirin EC 325 MG tablet Take 325 mg by mouth every 6 (six) hours as needed for mild pain or moderate pain.   Marland Kitchen atorvastatin (LIPITOR) 10 MG tablet Take 10 mg by mouth daily.   . benazepril-hydrochlorthiazide (LOTENSIN HCT) 20-12.5 MG tablet Take 1 tablet by mouth daily. 06/28/2015: Received from: External Pharmacy  . benzonatate (TESSALON) 100 MG capsule Take 2 capsules (200 mg total) by mouth 3 (three) times daily as needed.   . chlorpheniramine-HYDROcodone (TUSSIONEX PENNKINETIC ER) 10-8 MG/5ML SUER Take 5 mLs by mouth every 12 (twelve) hours as needed for cough.   . estradiol (VIVELLE-DOT) 0.1 MG/24HR patch APPLY 1 PATCH TO SKIN TWICE A WEEK   . HYDROcodone-acetaminophen (NORCO/VICODIN) 5-325 MG tablet Take 1 tablet by mouth every 6 (six) hours as needed.   . metoprolol tartrate (LOPRESSOR) 12.5 mg TABS tablet Take 12.5 mg by mouth 2 (two) times daily.   . metroNIDAZOLE (METROGEL VAGINAL) 0.75 % vaginal gel Place 1 Applicatorful vaginally 2 (two) times daily.   . Multiple Vitamin (MULTIVITAMIN WITH MINERALS) TABS tablet Take 1 tablet by mouth daily.   . Omega-3 Fatty Acids (FISH OIL PO) Take 1,500 mg by mouth daily.   . ondansetron (ZOFRAN) 4 MG tablet Take 1 tablet (4 mg total) by mouth every 6 (six) hours.   . promethazine (PHENERGAN) 25 MG  tablet Take 25 mg by mouth 2 (two) times daily as needed for nausea or vomiting.   . rizatriptan (MAXALT) 10 MG tablet Take 1 tablet (10 mg total) by mouth 2 (two) times daily as needed for migraine. May repeat in 2 hours if needed   . estradiol (ESTRACE VAGINAL) 0.1 MG/GM vaginal cream Place 1 Applicatorful vaginally at bedtime. (Patient not taking: Reported on 01/07/2017)   . [DISCONTINUED] fluconazole (DIFLUCAN) 100 MG tablet Take 1 tablet (100 mg total) by mouth daily.    No facility-administered encounter medications on file as of 01/07/2017.     Subjective See note from 12/22/2016 explaining presentation Pt states does feel better since last visit Has not had sex  Objective Much improved vulvovaginal health but now has a yeast infection s/p BV therapy  Pertinent ROS   Labs or studies     Impression Diagnoses this Encounter::   ICD-9-CM ICD-10-CM   1. Yeast vaginitis 112.1 B37.3     Established relevant diagnosis(es): Atrophic vaginitis  Plan/Recommendations: Meds ordered this encounter  Medications  . DISCONTD: fluconazole (DIFLUCAN) 100 MG tablet    Sig: Take 1 tablet (100 mg total) by mouth daily.    Dispense:  7 tablet    Refill:  0    Labs or Scans Ordered: No orders of the defined types were placed in this encounter.   Management:: Treat yeast Continue vaginal estrogen FU 6 weeks  Follow up Return in about 6  weeks (around 02/18/2017) for Follow up, with Dr Elonda Husky.        Face to face time:  15 minutes  Greater than 50% of the visit time was spent in counseling and coordination of care with the patient.  The summary and outline of the counseling and care coordination is summarized in the note above.   All questions were answered.  Past Medical History:  Diagnosis Date  . Anxiety   . Chronic abdominal pain   . Chronic back pain   . Complication of anesthesia 10/03/411   Unknown complication [facility] hospital during C section  . Irregular  heart beat   . Shortness of breath     Past Surgical History:  Procedure Laterality Date  . ABDOMINAL HYSTERECTOMY N/A 07/03/2015   Procedure: HYSTERECTOMY ABDOMINAL;  Surgeon: Florian Buff, MD;  Location: [facility];  Service: Gynecology;  Laterality: N/A;  . CERVICAL CERCLAGE    . CESAREAN SECTION    . SALPINGOOPHORECTOMY Bilateral 07/03/2015   Procedure: BILATERAL SALPINGO OOPHORECTOMY;  Surgeon: Florian Buff, MD;  Location: [facility];  Service: Gynecology;  Laterality: Bilateral;  . TONSILLECTOMY    . uterine ablation      OB History    No data available      No Known Allergies  Social History   Social History  . Marital status: Married    Spouse name: N/A  . Number of children: N/A  . Years of education: N/A   Social History Main Topics  . Smoking status: Never Smoker  . Smokeless tobacco: Never Used  . Alcohol use Yes     Comment: occasionally  . Drug use: No  . Sexual activity: Yes    Birth control/ protection: Surgical   Other Topics Concern  . None   Social History Narrative  . None    Family History  Problem Relation Age of Onset  . Hypertension Father   . Diabetes Father   . Other Sister        sciatica   . ADD / ADHD Son

## 2017-01-08 ENCOUNTER — Other Ambulatory Visit: Payer: Self-pay | Admitting: Obstetrics & Gynecology

## 2017-01-09 NOTE — Telephone Encounter (Signed)
Unable to complete call return to [facility] , [place], pharmacy closed.. Call earlier tonight reported pt needed to replace lost pain meds.  Review of record does not indicate any recent analgesic Rx.  Will await office hours to reconnect with pharmacy/ pt.

## 2017-02-18 ENCOUNTER — Ambulatory Visit: Payer: BLUE CROSS/BLUE SHIELD | Admitting: Obstetrics & Gynecology

## 2017-03-02 ENCOUNTER — Encounter: Payer: Self-pay | Admitting: Obstetrics & Gynecology

## 2017-03-02 ENCOUNTER — Ambulatory Visit (INDEPENDENT_AMBULATORY_CARE_PROVIDER_SITE_OTHER): Payer: BLUE CROSS/BLUE SHIELD | Admitting: Obstetrics & Gynecology

## 2017-03-02 VITALS — BP 114/78 | HR 60 | Ht 69.0 in | Wt 236.0 lb

## 2017-03-02 DIAGNOSIS — N952 Postmenopausal atrophic vaginitis: Secondary | ICD-10-CM | POA: Diagnosis not present

## 2017-03-02 DIAGNOSIS — F525 Vaginismus not due to a substance or known physiological condition: Secondary | ICD-10-CM | POA: Diagnosis not present

## 2017-03-02 DIAGNOSIS — T7421XS Adult sexual abuse, confirmed, sequela: Secondary | ICD-10-CM
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
## 2017-05-30 IMAGING — DX DG CHEST 2V
2 series · 2 of 2 positions shown · non-contrast
Comparison: None.

CLINICAL DATA: Cough and fever since [REDACTED].  Nonsmoker.

EXAM:
CHEST  2 VIEW

[chest pa]
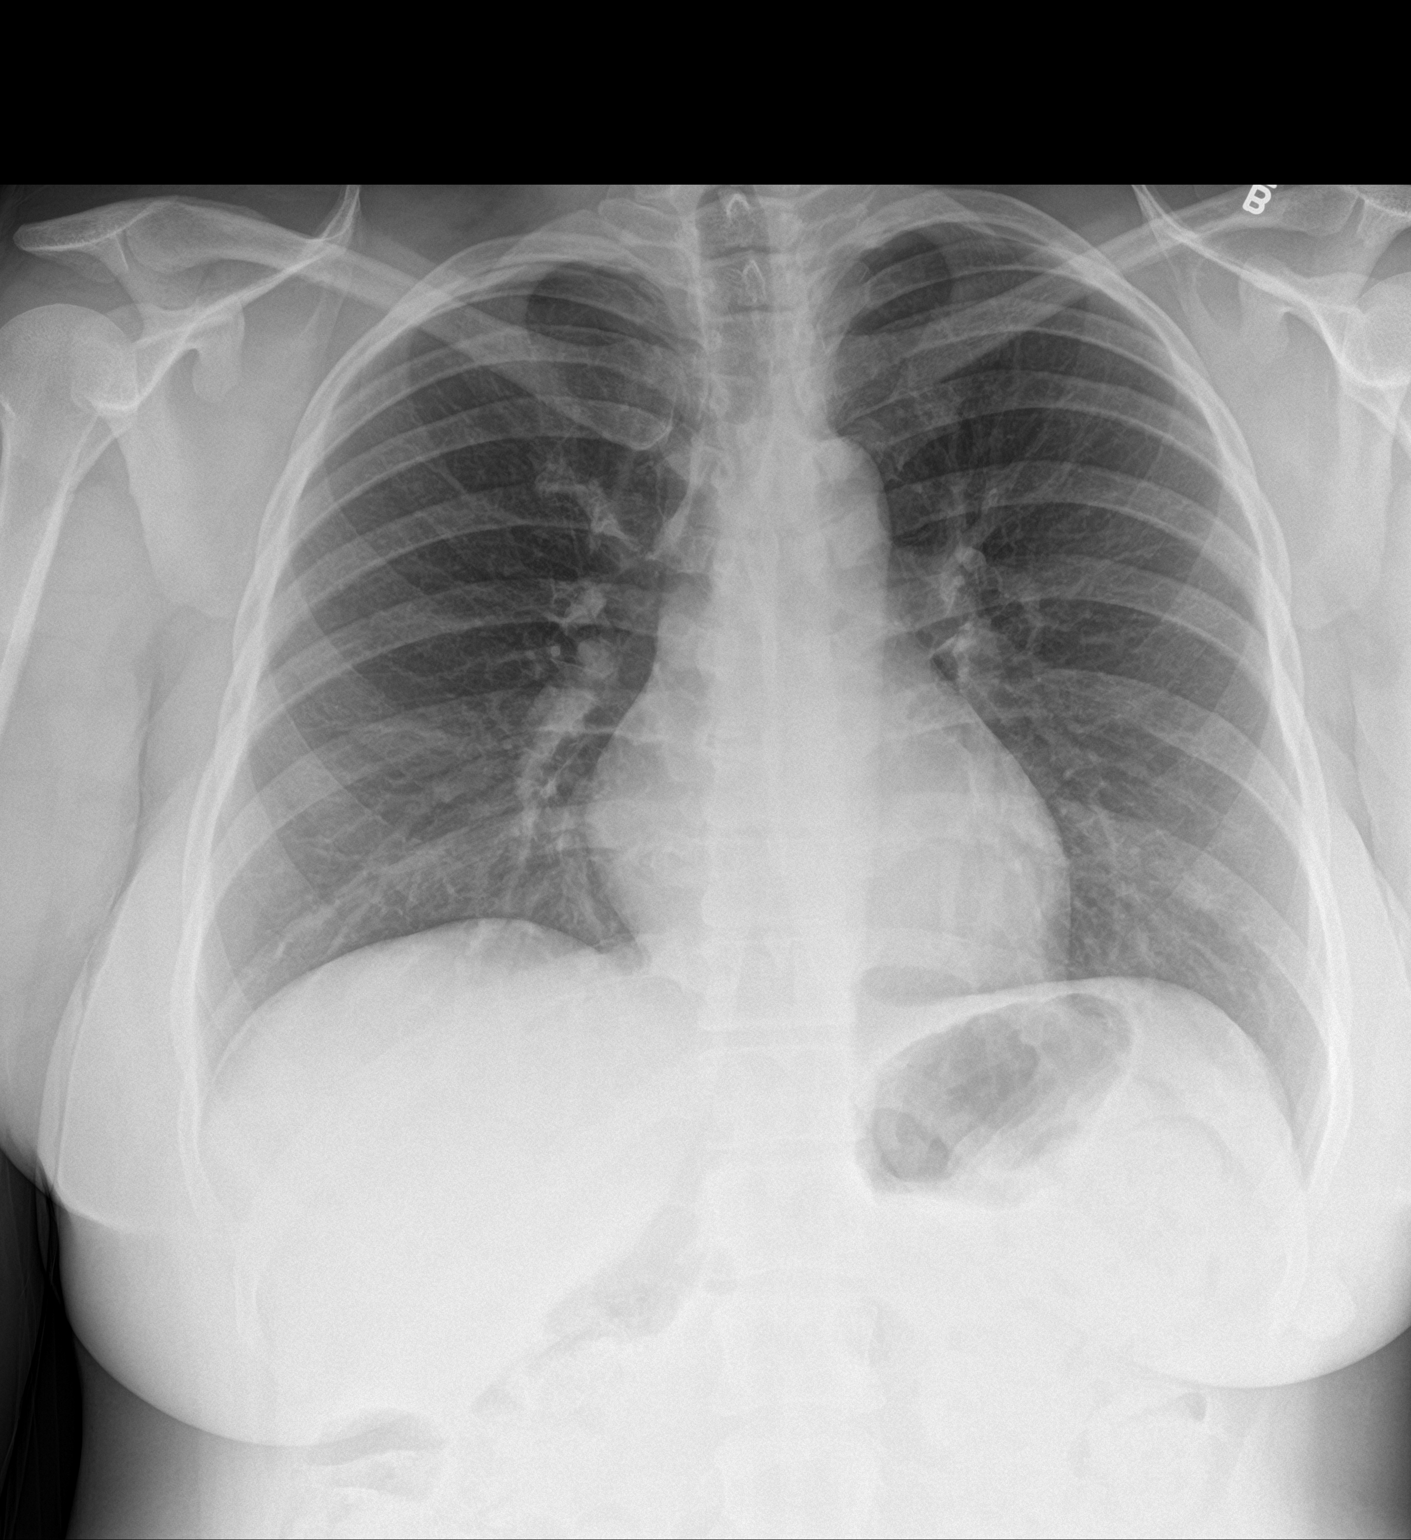

[chest lat]
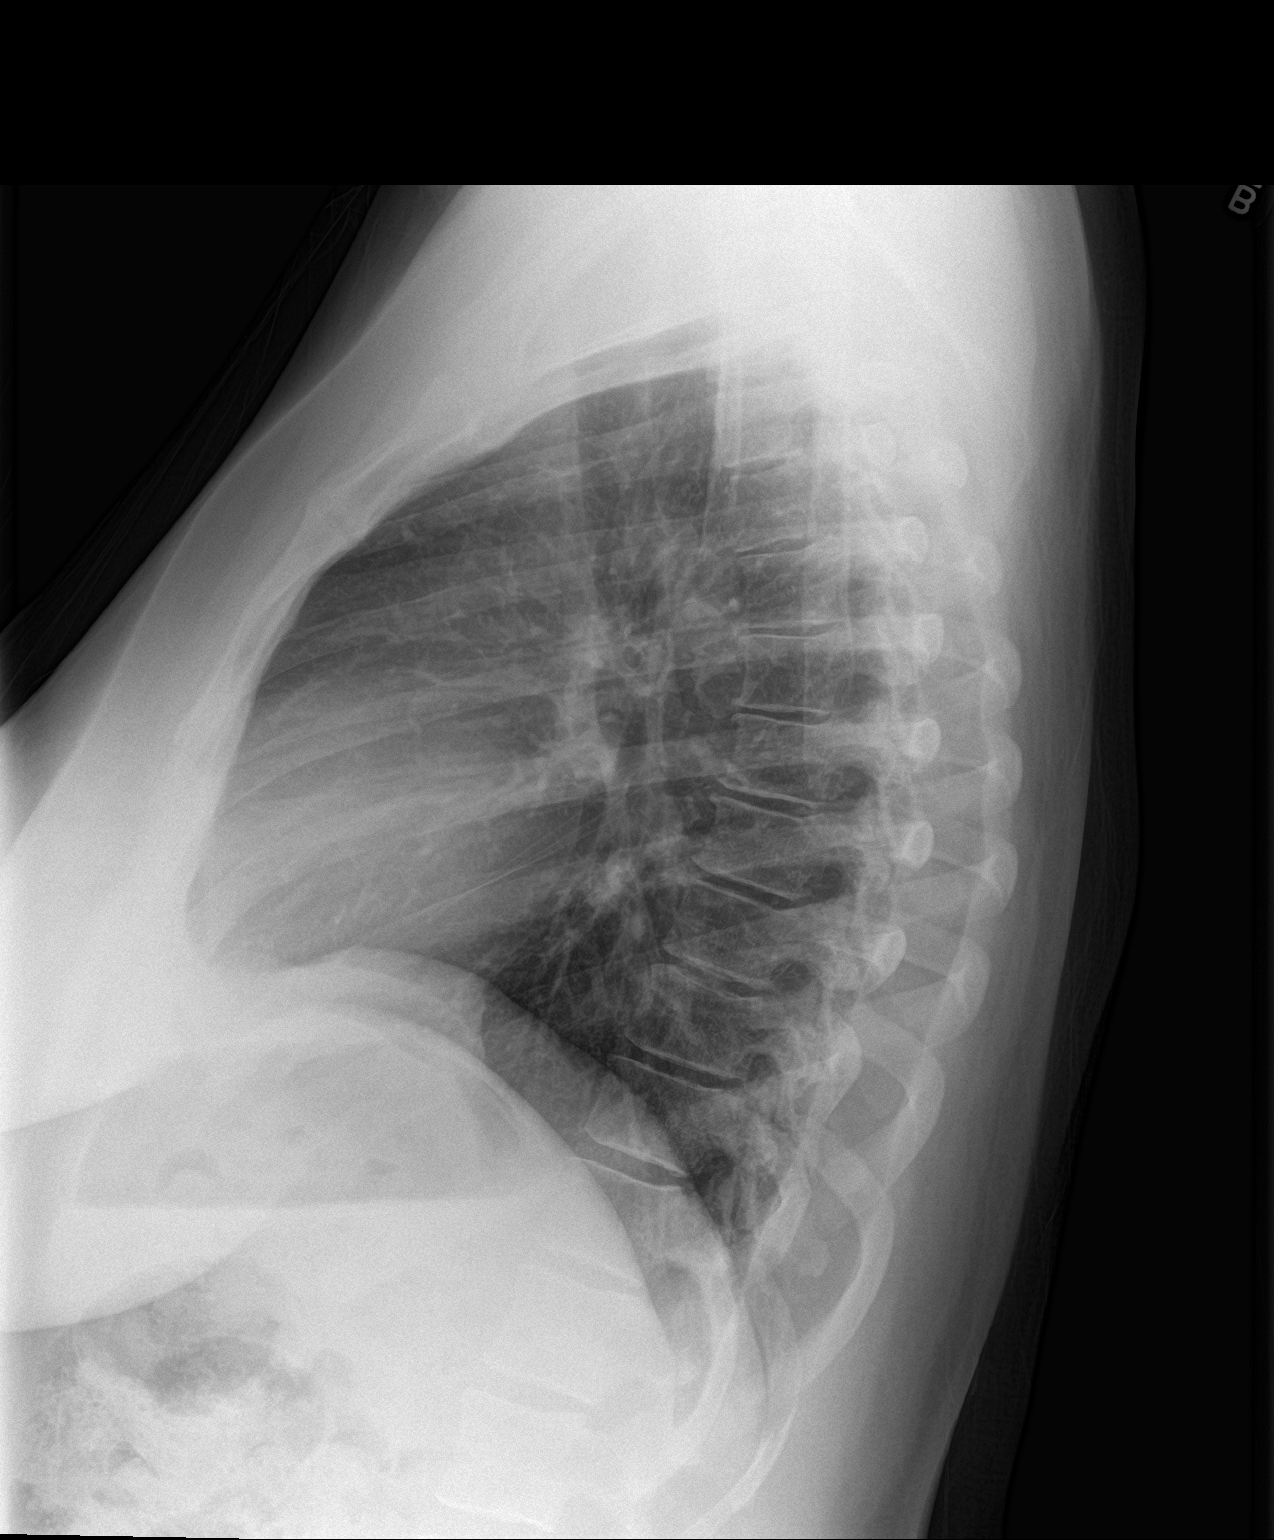

[2 of 2 positions shown; findings below may reference images not displayed]

FINDINGS: The heart size and mediastinal contours are within normal limits.
Both lungs are clear. The visualized skeletal structures are
unremarkable.
IMPRESSION: No active cardiopulmonary disease.

## 2017-06-01 ENCOUNTER — Ambulatory Visit: Payer: BLUE CROSS/BLUE SHIELD | Admitting: Obstetrics & Gynecology

## 2017-06-25 ENCOUNTER — Other Ambulatory Visit: Payer: Self-pay | Admitting: *Deleted

## 2017-06-28 MED ORDER — ESTRADIOL 0.1 MG/24HR TD PTTW: MEDICATED_PATCH | TRANSDERMAL | 11 refills | Status: DC

## 2017-07-02 ENCOUNTER — Other Ambulatory Visit: Payer: Self-pay | Admitting: *Deleted

## 2017-07-02 MED ORDER — ESTRADIOL 0.1 MG/24HR TD PTTW: MEDICATED_PATCH | TRANSDERMAL | 4 refills | Status: AC

## 2018-03-09 ENCOUNTER — Other Ambulatory Visit: Payer: Self-pay | Admitting: Obstetrics & Gynecology

## 2018-10-26 ENCOUNTER — Other Ambulatory Visit: Payer: Self-pay | Admitting: Obstetrics & Gynecology

## 2018-12-08 DIAGNOSIS — G43911 Migraine, unspecified, intractable, with status migrainosus: Secondary | ICD-10-CM | POA: Diagnosis not present

## 2018-12-08 DIAGNOSIS — I10 Essential (primary) hypertension: Secondary | ICD-10-CM | POA: Diagnosis not present

## 2018-12-08 DIAGNOSIS — E785 Hyperlipidemia, unspecified: Secondary | ICD-10-CM | POA: Diagnosis not present

## 2019-01-30 DIAGNOSIS — Z6835 Body mass index (BMI) 35.0-35.9, adult: Secondary | ICD-10-CM | POA: Diagnosis not present

## 2019-01-30 DIAGNOSIS — I10 Essential (primary) hypertension: Secondary | ICD-10-CM | POA: Diagnosis not present

## 2019-01-30 DIAGNOSIS — E785 Hyperlipidemia, unspecified: Secondary | ICD-10-CM | POA: Diagnosis not present

## 2019-01-30 DIAGNOSIS — G43911 Migraine, unspecified, intractable, with status migrainosus: Secondary | ICD-10-CM | POA: Diagnosis not present

## 2019-03-02 DIAGNOSIS — G8929 Other chronic pain: Secondary | ICD-10-CM | POA: Diagnosis not present

## 2019-03-02 DIAGNOSIS — F411 Generalized anxiety disorder: Secondary | ICD-10-CM | POA: Diagnosis not present

## 2019-03-02 DIAGNOSIS — F329 Major depressive disorder, single episode, unspecified: Secondary | ICD-10-CM | POA: Diagnosis not present

## 2019-03-02 DIAGNOSIS — M545 Low back pain: Secondary | ICD-10-CM | POA: Diagnosis not present

## 2019-03-02 DIAGNOSIS — Z79899 Other long term (current) drug therapy: Secondary | ICD-10-CM | POA: Diagnosis not present

## 2020-03-01 ENCOUNTER — Telehealth: Payer: Self-pay

## 2020-03-01 NOTE — Telephone Encounter (Signed)
NOTES ON FILE FROM [facility] [phone], SENT REFERRAL TO SCHEDULING

## 2020-03-14 ENCOUNTER — Encounter: Payer: Self-pay | Admitting: General Practice

## 2020-03-29 ENCOUNTER — Other Ambulatory Visit: Payer: Self-pay | Admitting: Obstetrics & Gynecology

## 2020-04-29 ENCOUNTER — Telehealth: Payer: Self-pay | Admitting: Obstetrics & Gynecology

## 2020-04-29 NOTE — Telephone Encounter (Signed)
Left message @ 12:02 pm. JSY
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
## 2020-04-30 NOTE — Telephone Encounter (Signed)
Pt having severe pain in vaginal area and back x 1 month. Also, vaginal discharge x 2 months. OTC yeast med just made it worse. I advised she needs to be seen. Girls at front desk at lunch. Pt advised to call back around 1:45 pm to schedule an appt. Pt voiced understanding. [place]

## 2020-05-23 ENCOUNTER — Encounter: Payer: BLUE CROSS/BLUE SHIELD | Admitting: Obstetrics & Gynecology

## 2021-05-06 ENCOUNTER — Other Ambulatory Visit: Payer: Self-pay | Admitting: Obstetrics & Gynecology

## 2022-05-12 ENCOUNTER — Other Ambulatory Visit: Payer: Self-pay | Admitting: Obstetrics & Gynecology

## 2022-08-14 ENCOUNTER — Other Ambulatory Visit: Payer: Self-pay | Admitting: Nurse Practitioner

## 2022-08-14 DIAGNOSIS — Z1231 Encounter for screening mammogram for malignant neoplasm of breast: Secondary | ICD-10-CM

## 2022-10-15 ENCOUNTER — Ambulatory Visit: Payer: Self-pay

## 2023-06-30 ENCOUNTER — Other Ambulatory Visit (HOSPITAL_COMMUNITY): Payer: Self-pay | Admitting: Internal Medicine

## 2023-06-30 DIAGNOSIS — I209 Angina pectoris, unspecified: Secondary | ICD-10-CM

## 2024-02-25 ENCOUNTER — Other Ambulatory Visit: Payer: Self-pay | Admitting: Obstetrics & Gynecology
